# Patient Record
Sex: Female | Born: 1980 | Race: White | Hispanic: No | Marital: Married | Smoking: Never smoker
Health system: Southern US, Community
[De-identification: ages and names within clinical notes are randomized; demographics above are authoritative.]

## PROBLEM LIST (undated history)

## (undated) DIAGNOSIS — E669 Obesity, unspecified: Secondary | ICD-10-CM

## (undated) DIAGNOSIS — F329 Major depressive disorder, single episode, unspecified: Secondary | ICD-10-CM

## (undated) DIAGNOSIS — F32A Depression, unspecified: Secondary | ICD-10-CM

## (undated) DIAGNOSIS — F419 Anxiety disorder, unspecified: Secondary | ICD-10-CM

## (undated) DIAGNOSIS — K219 Gastro-esophageal reflux disease without esophagitis: Secondary | ICD-10-CM

## (undated) HISTORY — DX: Obesity, unspecified: E66.9

## (undated) HISTORY — DX: Depression, unspecified: F32.A

## (undated) HISTORY — DX: Anxiety disorder, unspecified: F41.9

## (undated) HISTORY — DX: Major depressive disorder, single episode, unspecified: F32.9

## (undated) HISTORY — DX: Gastro-esophageal reflux disease without esophagitis: K21.9

## (undated) HISTORY — PX: OTHER SURGICAL HISTORY: SHX169

---
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
## 2001-07-15 ENCOUNTER — Other Ambulatory Visit: Admission: RE | Admit: 2001-07-15 | Discharge: 2001-07-15 | Payer: Self-pay | Admitting: Obstetrics and Gynecology

## 2002-07-21 ENCOUNTER — Other Ambulatory Visit: Admission: RE | Admit: 2002-07-21 | Discharge: 2002-07-21 | Payer: Self-pay | Admitting: Obstetrics and Gynecology

## 2003-09-01 ENCOUNTER — Other Ambulatory Visit: Admission: RE | Admit: 2003-09-01 | Discharge: 2003-09-01 | Payer: Self-pay | Admitting: Family Medicine

## 2003-09-03 ENCOUNTER — Other Ambulatory Visit: Admission: RE | Admit: 2003-09-03 | Discharge: 2003-09-03 | Payer: Self-pay | Admitting: Family Medicine

## 2004-03-08 ENCOUNTER — Ambulatory Visit: Payer: Self-pay | Admitting: Internal Medicine

## 2004-03-10 ENCOUNTER — Ambulatory Visit: Payer: Self-pay | Admitting: Family Medicine

## 2004-04-25 ENCOUNTER — Ambulatory Visit: Payer: Self-pay | Admitting: Family Medicine

## 2004-07-20 ENCOUNTER — Other Ambulatory Visit: Admission: RE | Admit: 2004-07-20 | Discharge: 2004-07-20 | Payer: Self-pay | Admitting: Obstetrics and Gynecology

## 2004-09-21 HISTORY — PX: TONSILLECTOMY AND ADENOIDECTOMY: SHX28

## 2005-04-19 ENCOUNTER — Other Ambulatory Visit: Admission: RE | Admit: 2005-04-19 | Discharge: 2005-04-19 | Payer: Self-pay | Admitting: Obstetrics and Gynecology

## 2005-05-15 ENCOUNTER — Ambulatory Visit: Payer: Self-pay | Admitting: Internal Medicine

## 2008-02-21 ENCOUNTER — Emergency Department (HOSPITAL_BASED_OUTPATIENT_CLINIC_OR_DEPARTMENT_OTHER): Admission: EM | Admit: 2008-02-21 | Discharge: 2008-02-21 | Payer: Self-pay | Admitting: Emergency Medicine

## 2008-02-21 IMAGING — CT CT MAXILLOFACIAL W/O CM
3 series · 16 of 47 positions shown, 19 images · non-contrast
Comparison: None

CLINICAL DATA: Injury to nose.

CT MAXILLOFACIAL WITHOUT CONTRAST
TECHNIQUE: Multidetector CT imaging of the maxillofacial
structures was performed. Multiplanar CT image reconstructions were
also generated.

[Series 3: face/orbit 2.0 h30s st · axial · 0.35mm/px · z∈[-168,-24]mm · 10 of 84 slices shown, 13 images]
[im 6/84  brain]
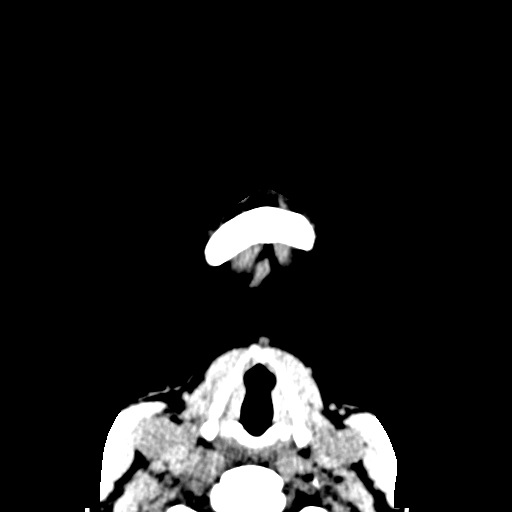
[im 6/84  bone]
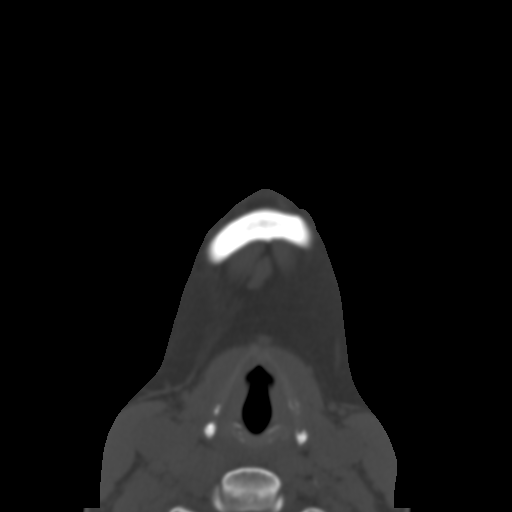
[im 15/84  bone]
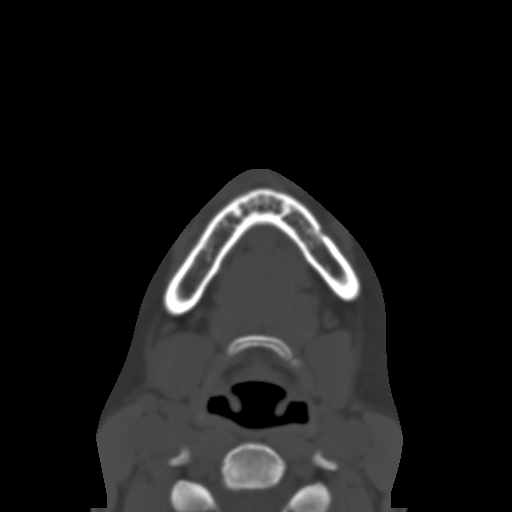
[im 23/84  bone]
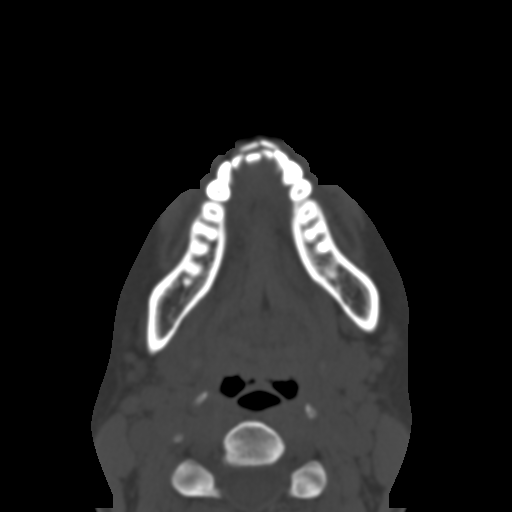
[im 29/84  bone]
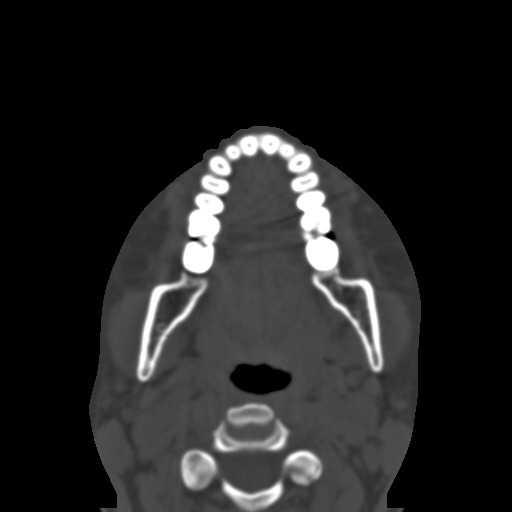
[im 38/84  brain]
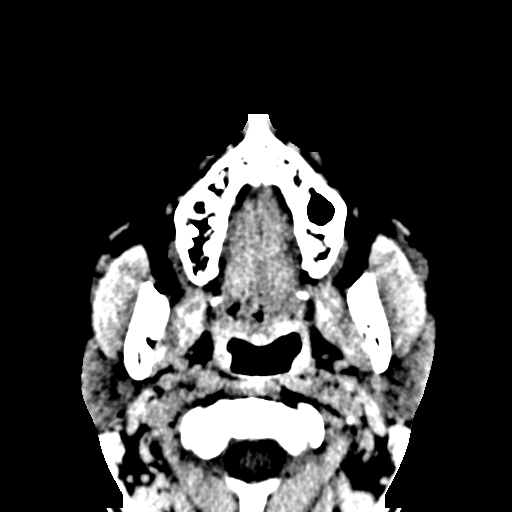
[im 38/84  bone]
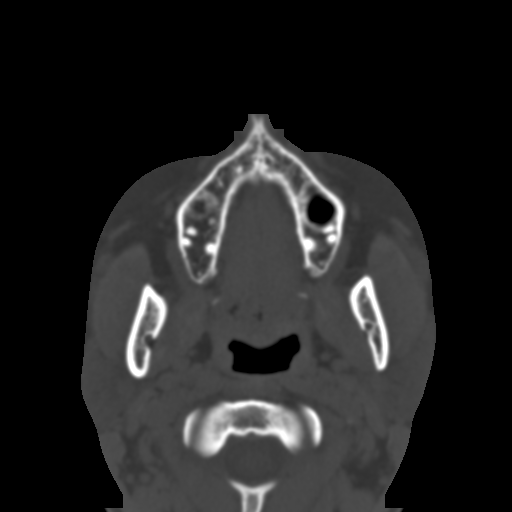
[im 46/84  bone]
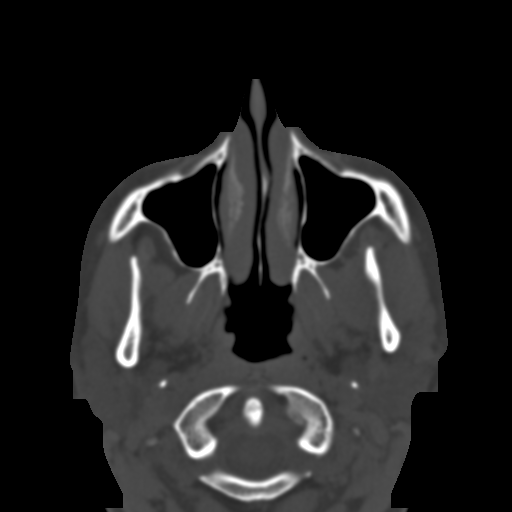
[im 55/84  bone]
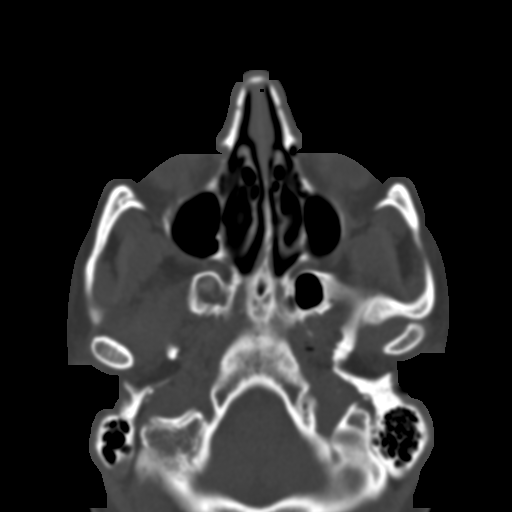
[im 63/84  bone]
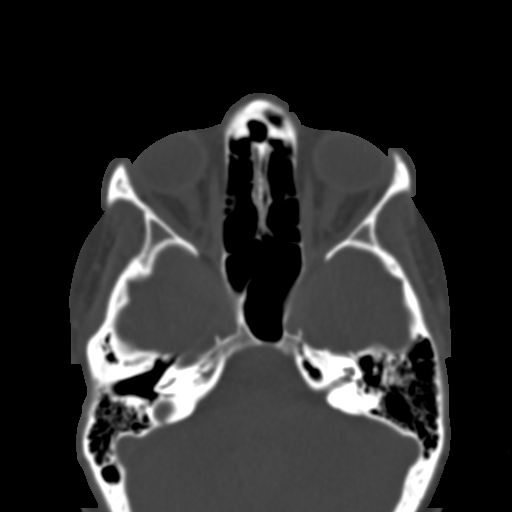
[im 69/84  brain]
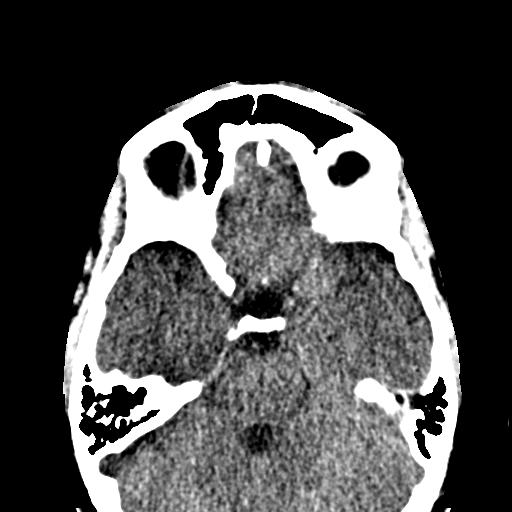
[im 69/84  bone]
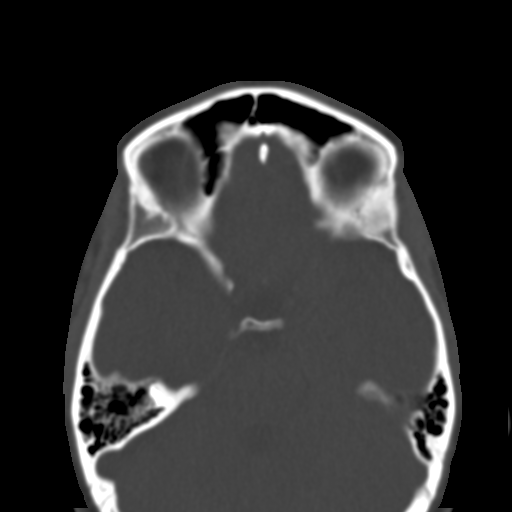
[im 78/84  bone]
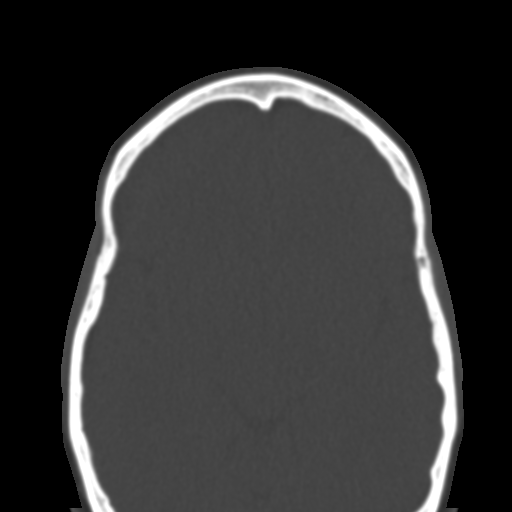

[Series 4: face/orbit 2.0 coronal · coronal · 0.36mm/px · 3 of 74 slices shown]
[im 25/74  bone]
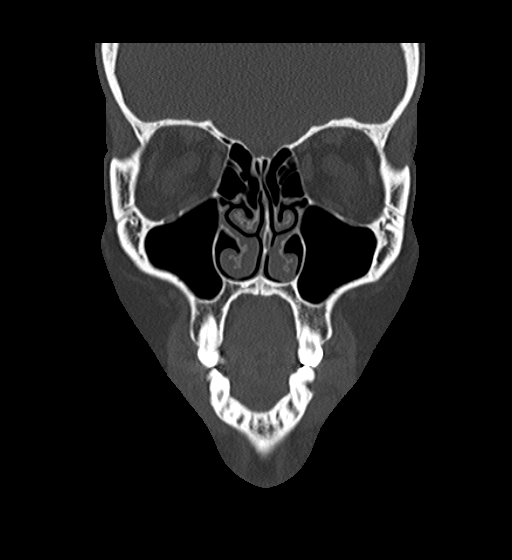
[im 33/74  bone]
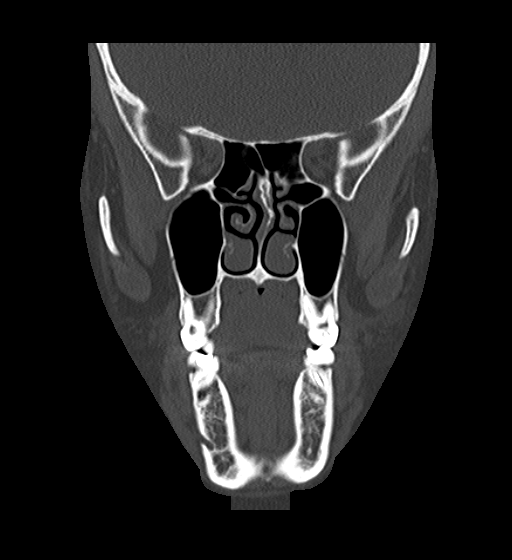
[im 41/74  bone]
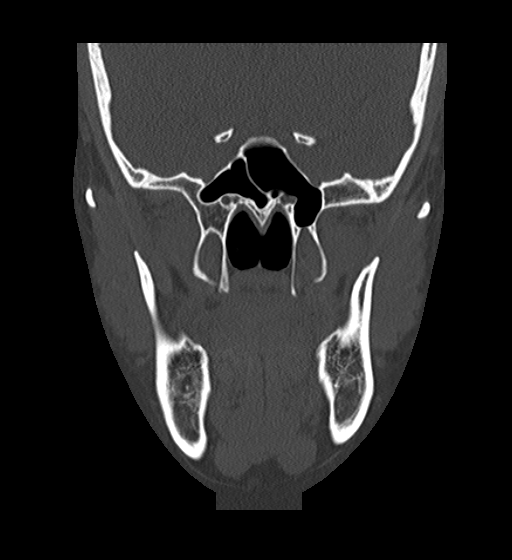

[Series 5: face/orbit 2.0 sagittal · sagittal · 0.42mm/px · 3 of 70 slices shown]
[im 24/70  bone]
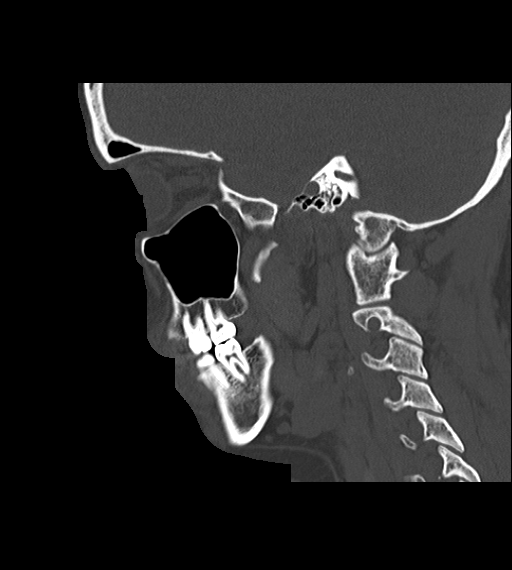
[im 35/70  bone]
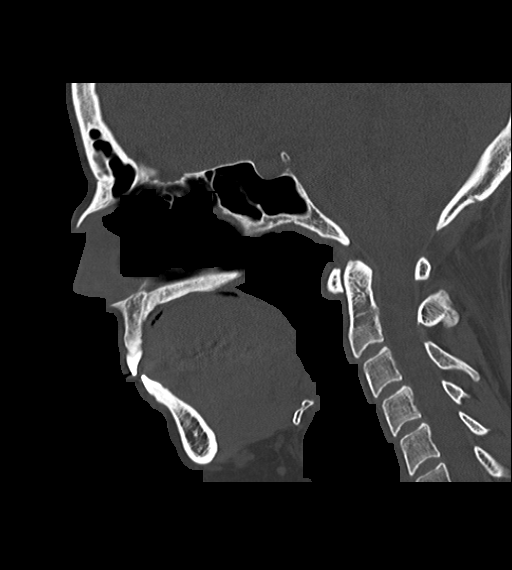
[im 47/70  bone]
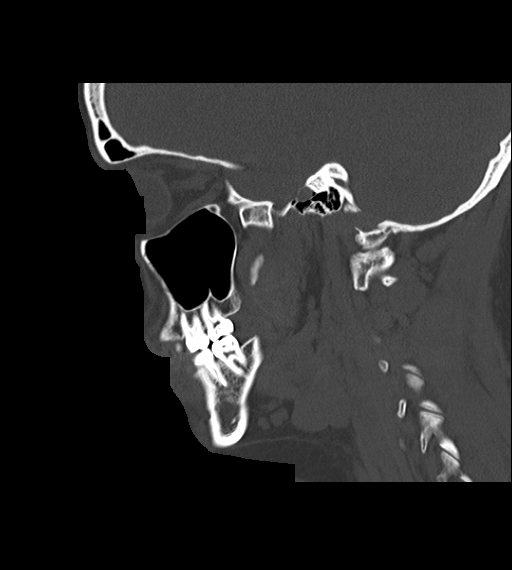

[16 of 47 positions shown; findings below may reference images not displayed]

FINDINGS: Negative for facial fracture.  There is no nasal
fracture.  The sinuses are clear.  The orbit appears normal
bilaterally.
IMPRESSION: Negative

## 2009-02-06 ENCOUNTER — Emergency Department (HOSPITAL_BASED_OUTPATIENT_CLINIC_OR_DEPARTMENT_OTHER): Admission: EM | Admit: 2009-02-06 | Discharge: 2009-02-06 | Payer: Self-pay | Admitting: Emergency Medicine

## 2009-04-26 ENCOUNTER — Emergency Department (HOSPITAL_BASED_OUTPATIENT_CLINIC_OR_DEPARTMENT_OTHER): Admission: EM | Admit: 2009-04-26 | Discharge: 2009-04-26 | Payer: Self-pay | Admitting: Emergency Medicine

## 2010-07-09 LAB — BASIC METABOLIC PANEL
BUN: 11 mg/dL (ref 6–23)
CO2: 20 mEq/L (ref 19–32)
Calcium: 10.1 mg/dL (ref 8.4–10.5)
Chloride: 104 mEq/L (ref 96–112)
Creatinine, Ser: 0.8 mg/dL (ref 0.4–1.2)
GFR calc Af Amer: >60 mL/min (ref >60)
GFR calc non Af Amer: >60 mL/min (ref >60)
Glucose, Bld: 97 mg/dL (ref 70–99)
Potassium: 3.9 mEq/L (ref 3.5–5.1)
Sodium: 142 mEq/L (ref 135–145)

## 2010-07-09 LAB — URINALYSIS, ROUTINE W REFLEX MICROSCOPIC
Bilirubin Urine: NEGATIVE
Glucose, UA: NEGATIVE mg/dL
Hgb urine dipstick: NEGATIVE
Ketones, ur: 15 mg/dL — AB
Nitrite: NEGATIVE
Protein, ur: NEGATIVE mg/dL
Specific Gravity, Urine: 1.025 (ref 1.005–1.030)
Urobilinogen, UA: 0.2 mg/dL (ref 0.0–1.0)
pH: 5.5 (ref 5.0–8.0)

## 2010-07-09 LAB — PREGNANCY, URINE: Preg Test, Ur: NEGATIVE

## 2010-07-27 LAB — URINALYSIS, ROUTINE W REFLEX MICROSCOPIC
Hgb urine dipstick: NEGATIVE
Ketones, ur: >80 mg/dL — AB
Specific Gravity, Urine: 1.034 — ABNORMAL HIGH (ref 1.005–1.030)
Urobilinogen, UA: 0.2 mg/dL (ref 0.0–1.0)
pH: 5.5 (ref 5.0–8.0)

## 2010-07-27 LAB — COMPREHENSIVE METABOLIC PANEL
ALT: 13 U/L (ref 0–35)
AST: 25 U/L (ref 0–37)
Alkaline Phosphatase: 81 U/L (ref 39–117)
CO2: 20 mEq/L (ref 19–32)
Calcium: 10.7 mg/dL — ABNORMAL HIGH (ref 8.4–10.5)
Chloride: 103 mEq/L (ref 96–112)
GFR calc Af Amer: >60 mL/min (ref >60)
GFR calc non Af Amer: >60 mL/min (ref >60)
Glucose, Bld: 174 mg/dL — ABNORMAL HIGH (ref 70–99)
Potassium: 3.8 mEq/L (ref 3.5–5.1)
Sodium: 141 mEq/L (ref 135–145)

## 2010-07-27 LAB — URINE MICROSCOPIC-ADD ON

## 2010-07-27 LAB — DIFFERENTIAL
Basophils Relative: 1 % (ref 0–1)
Eosinophils Absolute: 0.1 10*3/uL (ref 0.0–0.7)
Eosinophils Relative: 1 % (ref 0–5)
Lymphs Abs: 1.5 10*3/uL (ref 0.7–4.0)
Neutrophils Relative %: 82 % — ABNORMAL HIGH (ref 43–77)

## 2010-07-27 LAB — LIPASE, BLOOD: Lipase: 121 U/L (ref 23–300)

## 2010-07-27 LAB — CBC
Hemoglobin: 16 g/dL — ABNORMAL HIGH (ref 12.0–15.0)
MCHC: 33.6 g/dL (ref 30.0–36.0)
RBC: 5.67 MIL/uL — ABNORMAL HIGH (ref 3.87–5.11)
WBC: 13.1 10*3/uL — ABNORMAL HIGH (ref 4.0–10.5)

## 2011-04-27 ENCOUNTER — Encounter: Payer: Self-pay | Admitting: Internal Medicine

## 2011-05-09 ENCOUNTER — Encounter: Payer: Self-pay | Admitting: Internal Medicine

## 2011-05-10 ENCOUNTER — Telehealth: Payer: Self-pay | Admitting: Internal Medicine

## 2011-05-10 NOTE — Telephone Encounter (Signed)
Received copies from [facility],on 05/10/11. Forwarded  21pages to Dr. Rochel Brome review.

## 2011-05-14 ENCOUNTER — Ambulatory Visit: Payer: BC Managed Care – PPO

## 2011-05-14 ENCOUNTER — Ambulatory Visit (INDEPENDENT_AMBULATORY_CARE_PROVIDER_SITE_OTHER): Payer: BC Managed Care – PPO | Admitting: Internal Medicine

## 2011-05-14 ENCOUNTER — Encounter: Payer: Self-pay | Admitting: Internal Medicine

## 2011-05-14 DIAGNOSIS — R109 Unspecified abdominal pain: Secondary | ICD-10-CM

## 2011-05-14 DIAGNOSIS — R110 Nausea: Secondary | ICD-10-CM

## 2011-05-14 DIAGNOSIS — F419 Anxiety disorder, unspecified: Secondary | ICD-10-CM | POA: Insufficient documentation

## 2011-05-14 DIAGNOSIS — K219 Gastro-esophageal reflux disease without esophagitis: Secondary | ICD-10-CM

## 2011-05-14 LAB — COMPREHENSIVE METABOLIC PANEL
ALT: 20 U/L (ref 0–35)
Alkaline Phosphatase: 62 U/L (ref 39–117)
Potassium: 4.4 mEq/L (ref 3.5–5.1)
Sodium: 139 mEq/L (ref 135–145)
Total Bilirubin: 0.5 mg/dL (ref 0.3–1.2)
Total Protein: 7.3 g/dL (ref 6.0–8.3)

## 2011-05-14 LAB — CBC WITH DIFFERENTIAL/PLATELET
Basophils Absolute: 0 10*3/uL (ref 0.0–0.1)
Eosinophils Absolute: 0.2 10*3/uL (ref 0.0–0.7)
Eosinophils Relative: 2.7 % (ref 0.0–5.0)
HCT: 40.1 % (ref 36.0–46.0)
Lymphs Abs: 2.5 10*3/uL (ref 0.7–4.0)
MCHC: 34 g/dL (ref 30.0–36.0)
MCV: 83.5 fl (ref 78.0–100.0)
Monocytes Absolute: 0.5 10*3/uL (ref 0.1–1.0)
Platelets: 264 10*3/uL (ref 150.0–400.0)
RDW: 13.2 % (ref 11.5–14.6)

## 2011-05-14 LAB — HCG, QUANTITATIVE, PREGNANCY: hCG, Beta Chain, Quant, S: <2 m[IU]/mL

## 2011-05-14 LAB — LIPASE: Lipase: 24 U/L (ref 11.0–59.0)

## 2011-05-14 MED ORDER — HYOSCYAMINE SULFATE 0.125 MG SL SUBL: 0.1250 mg | SUBLINGUAL_TABLET | SUBLINGUAL | Status: AC | PRN

## 2011-05-14 MED ORDER — DEXLANSOPRAZOLE 60 MG PO CPDR: 60.0000 mg | DELAYED_RELEASE_CAPSULE | Freq: Every day | ORAL | Status: AC

## 2011-05-14 NOTE — Patient Instructions (Signed)
You have been scheduled for an abdominal ultrasound at [facility] [address] on 05/16/2011 at 8:00am. Please arrive 15 minutes prior to your appointment for registration. Make certain not to have anything to eat or drink 6 hours prior to your appointment. Should you need to reschedule your appointment, please contact radiology at [phone]  We have sent the following medications to your pharmacy for you to pick up at your convenience:Dexilant, Levsin  Your physician has requested that you go to the basement for the following lab work before leaving today: CBC, CMP, Lipase, Celiac, H.Pylori, HCG  Dr. Rhea Belton would like you to return in 1 month for a follow up visit

## 2011-05-14 NOTE — Progress Notes (Signed)
Subjective:    Patient ID: Jordan Harris, female    DOB: Sep 05, 1980, 31 y.o.   MRN: [id number]  Gastrophageal Reflux   Jordan Harris is a 31 year old female with a past medical history of GERD, controlled anxiety and depression who is seen today for the first time for evaluation of nausea and ongoing GERD. She is alone today. She reports a somewhat long history of heartburn and reflux symptoms. This was initially worked up in 2010 by Dr. Rhetta Mura and [facility]. At that time she was on Nexium twice daily, but did not find significant improvement in her reflux. She underwent an upper endoscopy in 2010 which was normal. She also recalls a solid phase gastric emptying study at that time, also reportedly normal. She was told that she was diagnosed possibly with IBS, and given a prescription for doxepin, but she never took this medication.  Today she reports ongoing issues with heartburn, despite twice daily ranitidine. She is currently having heartburn about twice per week, but over the last 2-3 weeks almost daily. She is avoiding eating late at night and sleeping with her head propped up. She does not drink alcohol. She reports ongoing globus sensation. No true dysphagia or odynophagia..  she is having nausea without vomiting. She also reports some cramping epigastric and left upper quadrant abdominal pain. This pain is worse with eating, but also can occur at night. She's tried to modify her diet by eating only bland foods such as saltine crackers and ginger ale, and this is helped only modestly. She's also had decreased appetite. She reports having tested herself for pregnancy at home 2 weeks ago and this was reportedly negative. She no longer has periods as she has an IUD in place.  She reports her bowel movements have been loose of late, though still only approximately once per day. She denies melena or bright red blood per rectum. She also reports occasionally feeling left lower corner spasm  after bowel movement. This a long-standing symptom for her dating back over 3 or 4 years. She also reports occasional hiccups or a "weird squeak", which happens at unexpected times. This is also long-standing.   Review of Systems ROS Constitutional: Negative for fever, chills, night sweats, see history of present illness HEENT: Negative for sore throat, mouth sores and trouble swallowing. Eyes: Negative for visual disturbance Respiratory: Negative for cough, chest tightness and shortness of breath Cardiovascular: Negative for chest pain, palpitations and lower extremity swelling Gastrointestinal: See history of present illness Genitourinary: Negative for dysuria and hematuria. Musculoskeletal: Negative for back pain, arthralgias and myalgias Skin: Negative for rash or color change Neurological: Negative for headaches, weakness, numbness Hematological: Negative for adenopathy, negative for easy bruising/bleeding Psychiatric/behavioral: Positive for depressed mood, positive for anxiety  Past Medical History  Diagnosis Date  . Anxiety   . Depression   GERD  Past Surgical History  Procedure Date  . Tonsillectomy and adenoidectomy 2006   Meds: Ranitidine Celexa  No Known Allergies  Family History  Problem Relation Age of Onset  . Heart disease Father   . Prostate cancer Father   . Pancreatic cancer Paternal Grandmother   . Lung cancer Mother   . Kidney cancer Mother    Social History  . Marital Status: Married    Number of Children: 0   Occupational History  . Hair Stylist    Social History Main Topics  . Smoking status: Never Smoker   . Smokeless tobacco: Never Used  . Alcohol Use: No  .  Drug Use: No      Objective:   Physical Exam BP 120/70  Pulse 79 Constitutional: Well-developed and well-nourished. No distress. HEENT: Normocephalic and atraumatic. Oropharynx is clear and moist. No oropharyngeal exudate. Conjunctivae are normal. Pupils are equal round and  reactive to light. No scleral icterus. Neck: Neck supple. Trachea midline. Cardiovascular: Normal rate, regular rhythm and intact distal pulses. No M/R/G Pulmonary/chest: Effort normal and breath sounds normal. No wheezing, rales or rhonchi. Abdominal: Soft, mild epigastric and midabdominal tenderness with deep palpation without rebound or guarding, nondistended. Bowel sounds active throughout. There are no masses palpable. No hepatosplenomegaly. Extremities: no clubbing, cyanosis, or edema Lymphadenopathy: No cervical adenopathy noted. Neurological: Alert and oriented to person place and time. Skin: Skin is warm and dry. No rashes noted. Psychiatric: Normal mood and affect. Behavior is normal.  Prior procedures EGD 12/20/2008--normal esophagus, normal stomach, normal duodenum to the third portion    Assessment & Plan:   31 year old female with a past medical history of GERD, controlled anxiety and depression who is seen today for the first time for evaluation of nausea and ongoing GERD  1. 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 She has had upper endoscopy which was normal, and this is reassuring. I cannot exclude a biliary cause of her pain, and I like to rule her out for celiac disease. I will perform labs today to include CBC, CMP, celiac panel, quantitative beta hCG, and an H. pylori serum antibody. Given that she did not respond to Nexium in the past, I will switch her from an H2 blocker to PPI, though this time I will use Dexilant.  We discussed how best to use this, specifically 30 minutes to one hour before breakfast. Also order a complete abdominal ultrasound, and this will also evaluate her gallbladder. Finally I will give her prescription for Levsin which she can use as needed for abdominal pain/spasm. I will see her back in one month's time to assess her response to new PPI therapy. At this point which also have labs and imaging  results back which may help with further treatment decisions. We have discussed GERD hygiene and I  encouraged her to continue with lifestyle modifications regarding her GERD.

## 2011-05-16 ENCOUNTER — Ambulatory Visit
Admission: RE | Admit: 2011-05-16 | Discharge: 2011-05-16 | Disposition: A | Discharge status: Home / Self Care | Payer: BC Managed Care – PPO | Source: Ambulatory Visit | Attending: Internal Medicine | Admitting: Internal Medicine

## 2011-05-16 ENCOUNTER — Telehealth: Payer: Self-pay | Admitting: *Deleted

## 2011-05-16 DIAGNOSIS — R110 Nausea: Secondary | ICD-10-CM

## 2011-05-16 IMAGING — US US ABDOMEN COMPLETE
1 series · 14 of 25 positions shown · non-contrast
Comparison: None.

CLINICAL DATA: Abdominal pain for 3 weeks.  Nausea.

COMPLETE ABDOMINAL ULTRASOUND

[Series 1: us abdomen complete · 0.26mm/px · 14 of 58 slices shown]
[im 1/58]
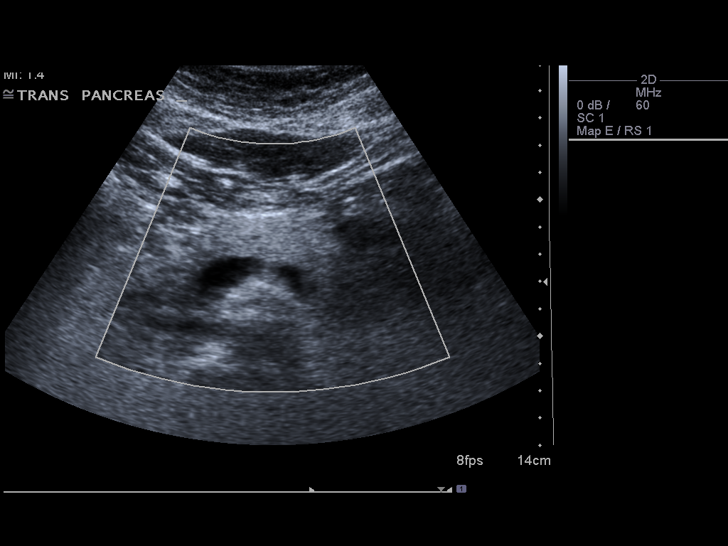
[im 5/58]
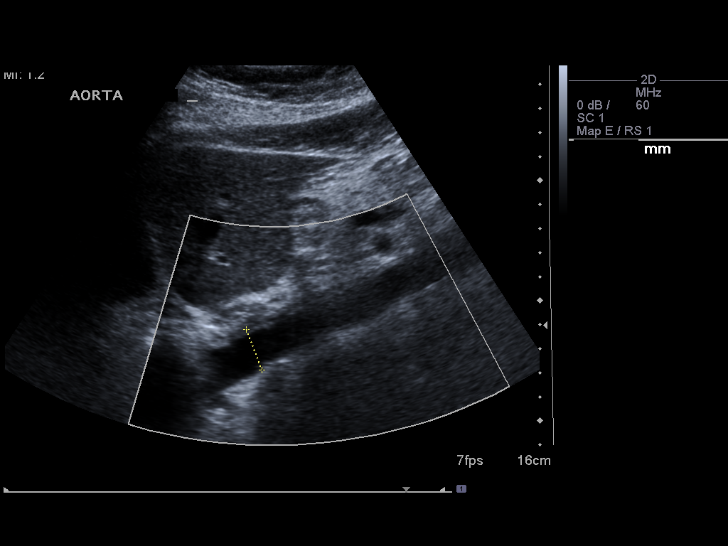
[im 10/58]
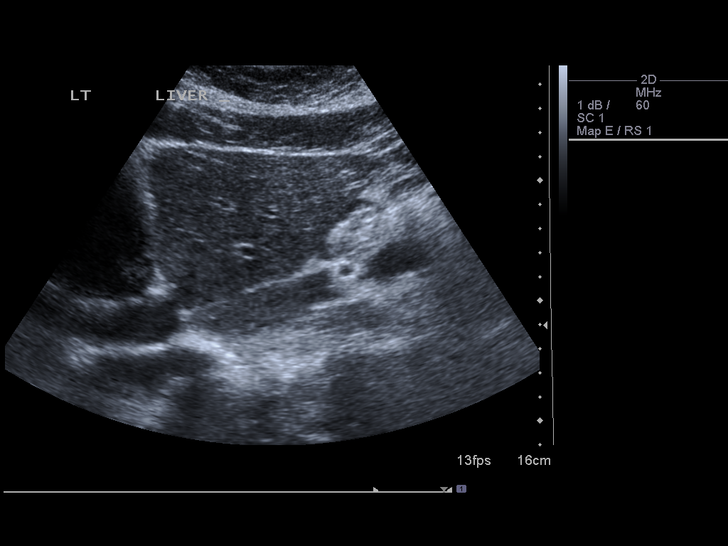
[im 15/58]
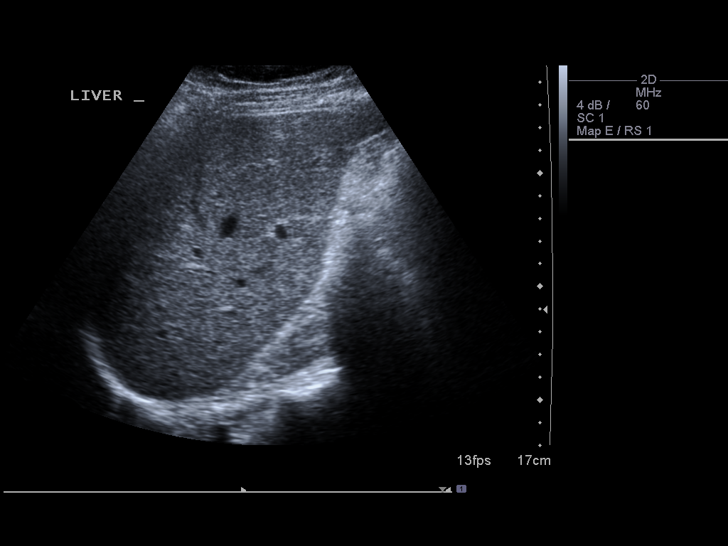
[im 20/58]
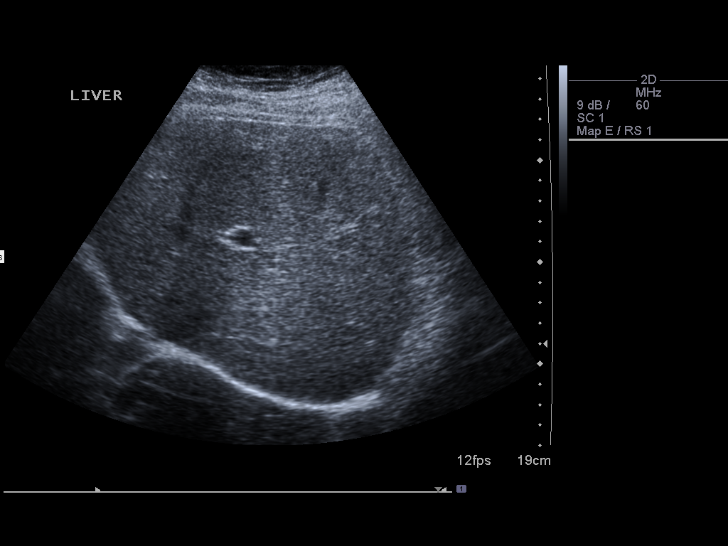
[im 22/58]
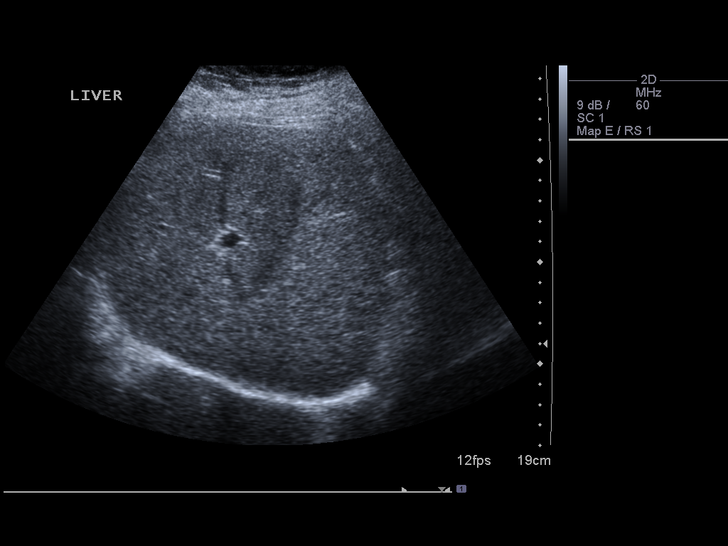
[im 27/58]
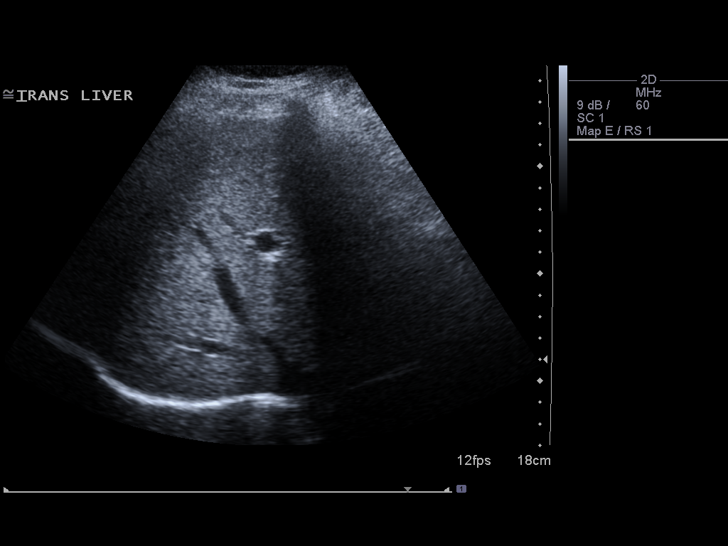
[im 31/58]
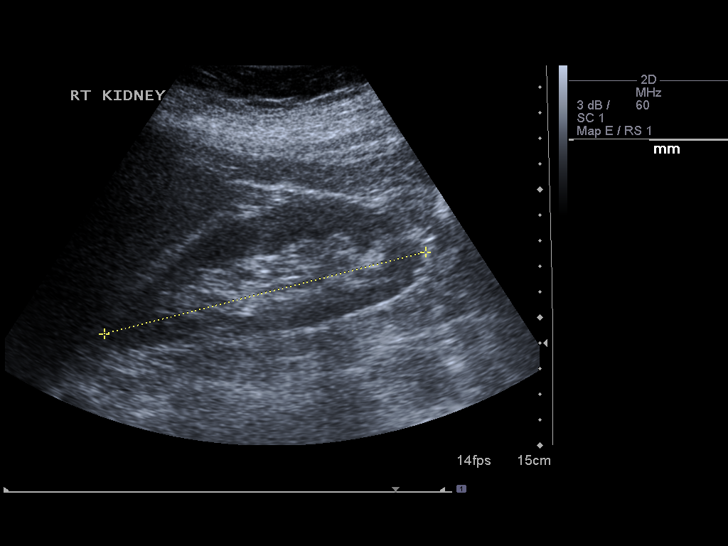
[im 36/58]
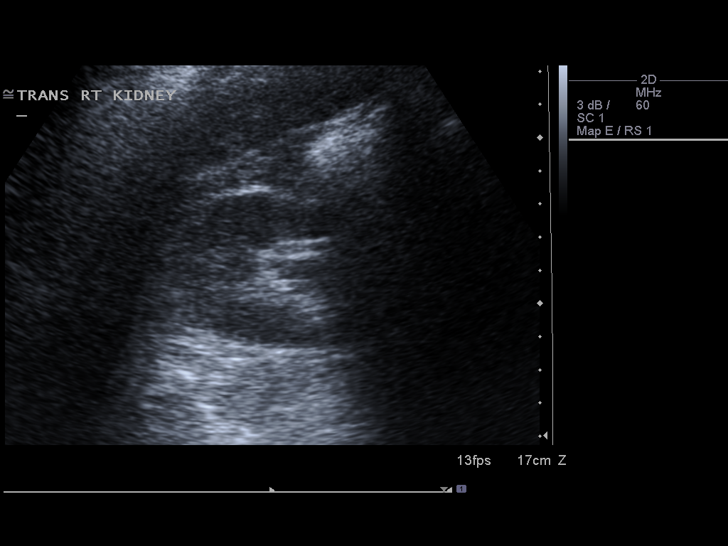
[im 39/58]
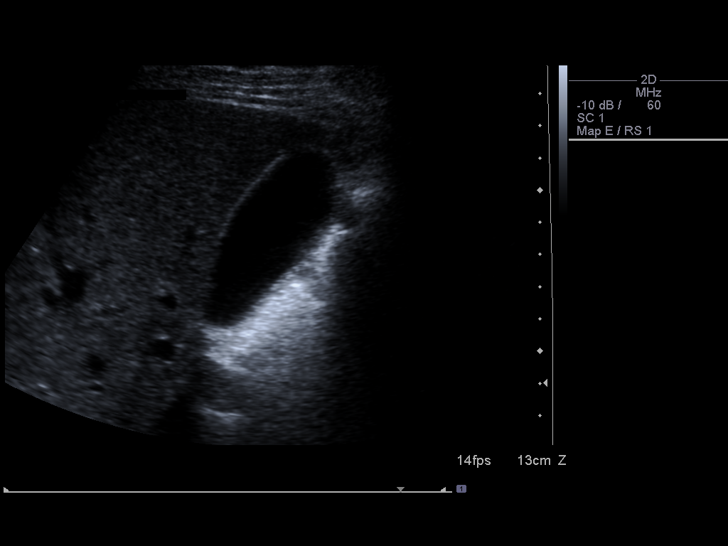
[im 43/58]
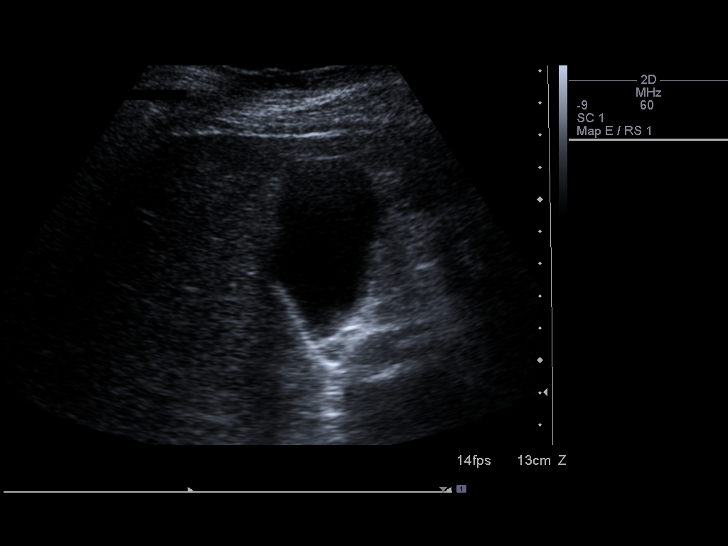
[im 48/58]
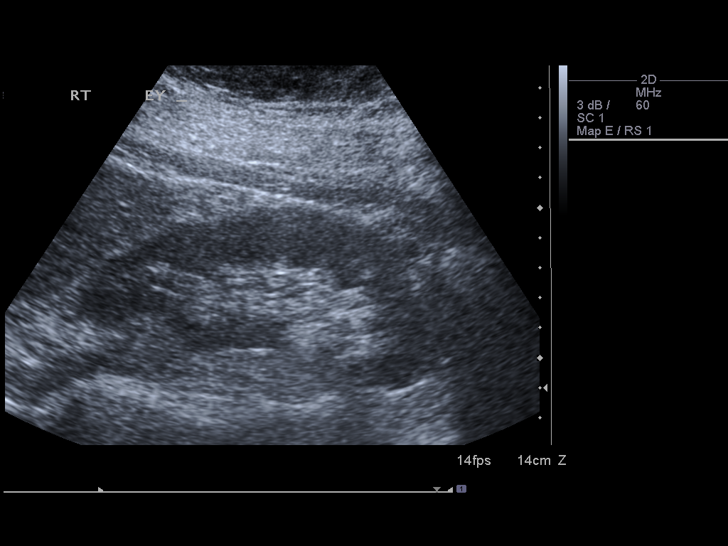
[im 53/58]
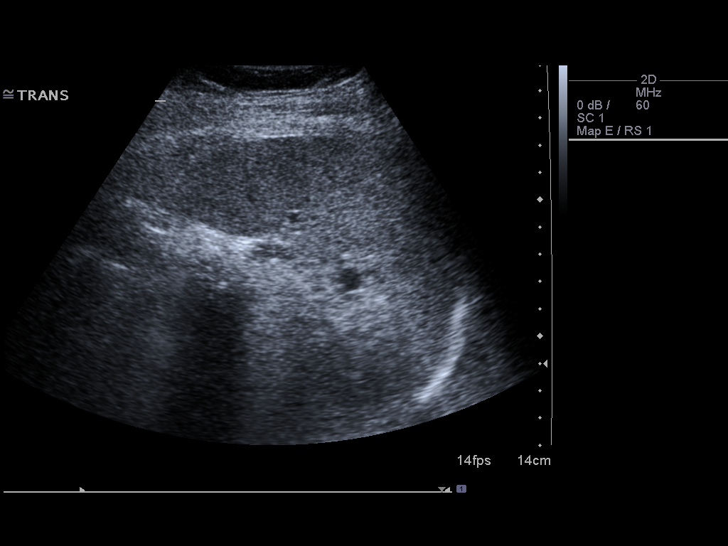
[im 58/58]
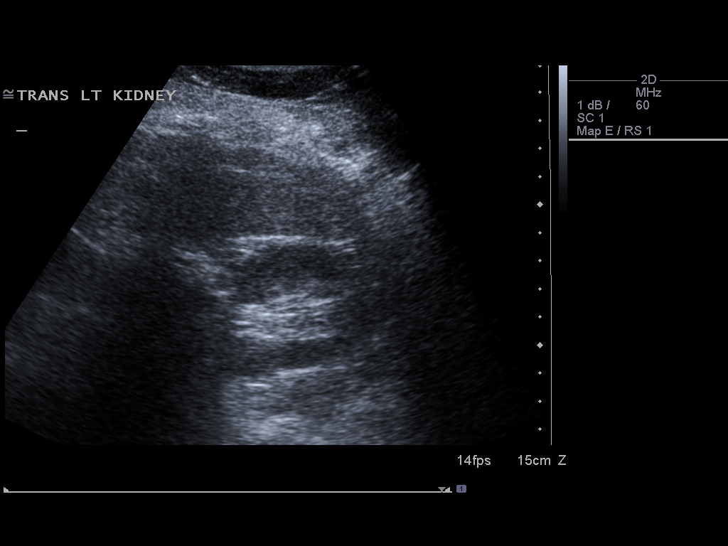

[14 of 25 positions shown; findings below may reference images not displayed]

FINDINGS: Gallbladder:  Normal, without wall thickening, stone, or
pericholecystic fluid.  Sonographic Murphy's sign was not elicited.

Common bile duct: Normal, 3 mm.

Liver: Normal in echogenicity, without focal lesion.

IVC: Negative

Pancreas:  Negative

Spleen:  Normal in size and echogenicity.

Right Kidney:  13.0 cm. No hydronephrosis.

Left Kidney:  13.4 cm. No hydronephrosis.

Abdominal aorta:  Nonaneurysmal without ascites.
IMPRESSION: Normal abdominal ultrasound.  No explanation for pain and nausea.

## 2011-05-16 NOTE — Telephone Encounter (Signed)
Notes Recorded by Erick Blinks, MD on 05/16/2011 at 9:17 AM Labs all normal and this is reassuring. H P ab neg, thyroid normal, neg celiac, normal blood counts, not pregnant   Informed pt of lab and U/S results. Pt stated understanding. Pt will f/u on 06/29/11 at 0900am. She reports improvement in her symptoms with Dexilant.

## 2011-05-16 NOTE — Telephone Encounter (Signed)
Message copied by Florene Glen on Wed May 16, 2011  2:09 PM ------      Message from: Beverley Fiedler      Created: Wed May 16, 2011 11:27 AM       Abdominal ultrasound was totally normal.      I messaged to you about her labs earlier, they were also unremarkable.      Hopefully the new PPI therapy will improve some of her symptoms. I will see her back in 4-6 weeks to assess her response to the medication (can you please be sure that the followup appointment is made). Thank you

## 2011-05-25 ENCOUNTER — Telehealth: Payer: Self-pay | Admitting: Internal Medicine

## 2011-05-25 NOTE — Telephone Encounter (Signed)
Instructed pt to stop the Dexilant and begin OTC pantoprazole. Reinforced the fact she should thake the med 30 min to 1 hour prior to her 1st meal. Call if no better; pt stated understanding.

## 2011-05-25 NOTE — Telephone Encounter (Signed)
Pt with hx of GERD dx in 2010 with normal EGD and GES; controlled Anxiety and Depression. OV 05/14/11 for N/V dx with Acid Peptic Disease; normal Abd. U/S and normal labs negative for Celiac. Pt was given Dexilant samples and a script for Levsin. Pt reports she started the Dexilant on 05/15/11 and her reflux was improving. By 05/19/11 she started stabbing pain and cramping like she was going to have diarrhea. She held the Dexilant 05/21/11 and 05/22/11 and saw Mike Gip, PA as a client on 05/23/11 who stated she hadn't heard of that type of reaction. Amy asked her to restart the med on 05/24/11 and the pain returned today. Pt failed Nexium and Zantac therapy. Please advise. Thanks.

## 2011-05-25 NOTE — Telephone Encounter (Signed)
D/c dexilant, trial of pantoprazole 40 mg daily. Still 30 min to 1 hour before 1st meal. Should work well for GERD. She should update [place] if not better

## 2011-05-28 ENCOUNTER — Other Ambulatory Visit: Payer: Self-pay | Admitting: Gastroenterology

## 2011-05-28 ENCOUNTER — Other Ambulatory Visit: Payer: Self-pay | Admitting: Internal Medicine

## 2011-05-28 DIAGNOSIS — R110 Nausea: Secondary | ICD-10-CM

## 2011-06-27 ENCOUNTER — Encounter: Payer: Self-pay | Admitting: Internal Medicine

## 2011-06-29 ENCOUNTER — Ambulatory Visit: Payer: BC Managed Care – PPO | Admitting: Internal Medicine

## 2011-07-25 ENCOUNTER — Other Ambulatory Visit: Payer: Self-pay | Admitting: Gastroenterology

## 2011-07-25 MED ORDER — PANTOPRAZOLE SODIUM 40 MG PO TBEC: 40.0000 mg | DELAYED_RELEASE_TABLET | Freq: Every day | ORAL | Status: DC

## 2011-07-25 NOTE — Telephone Encounter (Signed)
Per dr. Rhea Belton send in a Rx for protonix for pt.

## 2011-09-26 ENCOUNTER — Ambulatory Visit (INDEPENDENT_AMBULATORY_CARE_PROVIDER_SITE_OTHER): Payer: BC Managed Care – PPO | Admitting: General Surgery

## 2011-10-05 ENCOUNTER — Ambulatory Visit (INDEPENDENT_AMBULATORY_CARE_PROVIDER_SITE_OTHER): Payer: BC Managed Care – PPO | Admitting: General Surgery

## 2011-10-05 ENCOUNTER — Encounter (INDEPENDENT_AMBULATORY_CARE_PROVIDER_SITE_OTHER): Payer: Self-pay | Admitting: General Surgery

## 2011-10-05 DIAGNOSIS — Z6841 Body Mass Index (BMI) 40.0 and over, adult: Secondary | ICD-10-CM

## 2011-10-05 NOTE — Progress Notes (Signed)
Patient ID: Jordan Harris, female   DOB: 01/30/1981, 31 y.o.   MRN: [id number]  Chief Complaint  Patient presents with  . Weight Loss Surgery    HPI Jordan Harris is a 31 y.o. female.  HPI  The patient presents for evaluation for possible weight loss surgery. She has attended our informational session and is most interested in a sleeve gastrectomy. She has a BMI of 42 with gastroesophageal reflux disease. She has trouble with her weight since approximately 2007 and now she feels that she has gained too much weight that she is "too big to lose the weight." She is walking and has tried several diets with the most success coming from a "biggest loser" competition. She does have any reflux and takes Zantac once daily. She says that if she does not take her Zantac as she feels some fullness in her throat she feels that her reflux is diet induced. Past Medical History  Diagnosis Date  . Anxiety   . Depression   . GERD (gastroesophageal reflux disease)     Past Surgical History  Procedure Date  . Tonsillectomy and adenoidectomy 09/2004    Family History  Problem Relation Age of Onset  . Heart disease Father   . Prostate cancer Father   . Cancer Father     prostate  . Pancreatic cancer Paternal Grandmother   . Lung cancer Mother   . Kidney cancer Mother   . Cancer Mother     lung,kidney  . Cancer Paternal Grandfather     colon    Social History History  Substance Use Topics  . Smoking status: Never Smoker   . Smokeless tobacco: Never Used  . Alcohol Use: Yes     2 glasses of wine per week    No Known Allergies  Current Outpatient Prescriptions  Medication Sig Dispense Refill  . citalopram (CELEXA) 40 MG tablet Take 40 mg by mouth daily.      . pantoprazole (PROTONIX) 40 MG tablet Take 1 tablet (40 mg total) by mouth daily.  30 tablet  1  . ranitidine (ZANTAC) 150 MG capsule Take 150 mg by mouth. 1 -2 qd        Review of Systems Review of Systems All other review of  systems negative or noncontributory except as stated in the HPI  Blood pressure 134/82, pulse 70, temperature 97.4 F (36.3 C), temperature source Temporal, resp. rate 14, height 5\' 7"  (1.702 m), weight 268 lb 2 oz (121.621 kg).  Physical Exam Physical Exam Physical Exam  Nursing note and vitals reviewed. Constitutional: She is oriented to person, place, and time. She appears well-developed and well-nourished. No distress.  HENT:  Head: Normocephalic and atraumatic.  Mouth/Throat: No oropharyngeal exudate.  Eyes: Conjunctivae and EOM are normal. Pupils are equal, round, and reactive to light. Right eye exhibits no discharge. Left eye exhibits no discharge. No scleral icterus.  Neck: Normal range of motion. Neck supple. No tracheal deviation present.  Cardiovascular: Normal rate, regular rhythm, normal heart sounds and intact distal pulses.   Pulmonary/Chest: Effort normal and breath sounds normal. No stridor. No respiratory distress. She has no wheezes.  Abdominal: Soft. Bowel sounds are normal. She exhibits no distension and no mass. There is no tenderness. There is no rebound and no guarding.  Musculoskeletal: Normal range of motion. She exhibits no edema and no tenderness.  Neurological: She is alert and oriented to person, place, and time.  Skin: Skin is warm and dry. No rash  noted. She is not diaphoretic. No erythema. No pallor.  Psychiatric: She has a normal mood and affect. Her behavior is normal. Judgment and thought content normal.    Data Reviewed   Assessment    Morbid obesity with a BMI of 42 and GERD And I think that she would qualify for any of the procedures and but she is most interested in the sleeve gastrectomy. She is not interested in a LAP-BAND and she is much less interested in the Roux-en-Y gastric bypass. I discussed with her the pros and cons and the risks and benefits of the procedure. I expressly discussed with her the possibility of worsening her reflux even  to the point of no improvement with medications and and potentially requiring conversion to Roux-en-Y gastric bypass. She expressed understanding of this and still would like to proceed with vertical sleeve gastrectomy.   I recommended that she stop her Zantac for a few weeks to see how bad her reflux can be. We will discuss this again later. If her refluxes to severe, then she may be a better candidate for Roux-en-Y gastric bypass but again, we will leave it up to her to decide. The risks of infection, bleeding, pain, scarring, weight regain, too little or too much weight loss, vitamin deficiencies and need for lifelong vitamin supplementation, hair loss, need for protein supplementation, leaks, stricture, reflux, food intolerance, need for reoperation and conversion to roux Y gastric bypass, need for open surgery, injury to spleen or surrounding structures, DVT's, PE, and death again discussed with the patient and the patient expressed understanding and desires to proceed with laparoscopic vertical sleeve gastrectomy, possible open, intraoperative endoscopy. . Was spent discussing the procedures with the patient.     Plan    We will start with the preoperative laboratory studies, upper GI, nutrition and psychology evaluations.       Lodema Pilot DAVID 10/05/2011, 2:53 PM

## 2011-10-12 ENCOUNTER — Ambulatory Visit (HOSPITAL_COMMUNITY): Payer: BC Managed Care – PPO

## 2011-10-29 ENCOUNTER — Encounter: Payer: BC Managed Care – PPO | Attending: General Surgery | Admitting: *Deleted

## 2011-10-29 ENCOUNTER — Ambulatory Visit (HOSPITAL_COMMUNITY)
Admission: RE | Admit: 2011-10-29 | Discharge: 2011-10-29 | Disposition: A | Discharge status: Home / Self Care | Payer: BC Managed Care – PPO | Source: Ambulatory Visit | Attending: General Surgery | Admitting: General Surgery

## 2011-10-29 ENCOUNTER — Encounter: Payer: Self-pay | Admitting: *Deleted

## 2011-10-29 DIAGNOSIS — K219 Gastro-esophageal reflux disease without esophagitis: Secondary | ICD-10-CM | POA: Insufficient documentation

## 2011-10-29 DIAGNOSIS — Z01818 Encounter for other preprocedural examination: Secondary | ICD-10-CM | POA: Insufficient documentation

## 2011-10-29 DIAGNOSIS — F3289 Other specified depressive episodes: Secondary | ICD-10-CM | POA: Insufficient documentation

## 2011-10-29 DIAGNOSIS — Z6841 Body Mass Index (BMI) 40.0 and over, adult: Secondary | ICD-10-CM | POA: Insufficient documentation

## 2011-10-29 DIAGNOSIS — F329 Major depressive disorder, single episode, unspecified: Secondary | ICD-10-CM | POA: Insufficient documentation

## 2011-10-29 DIAGNOSIS — Z713 Dietary counseling and surveillance: Secondary | ICD-10-CM | POA: Insufficient documentation

## 2011-10-29 LAB — CBC WITH DIFFERENTIAL/PLATELET
Basophils Relative: 0 % (ref 0–1)
HCT: 39.5 % (ref 36.0–46.0)
Hemoglobin: 13.5 g/dL (ref 12.0–15.0)
MCHC: 34.2 g/dL (ref 30.0–36.0)
Monocytes Absolute: 0.5 10*3/uL (ref 0.1–1.0)
Monocytes Relative: 7 % (ref 3–12)
Neutro Abs: 3.9 10*3/uL (ref 1.7–7.7)

## 2011-10-29 IMAGING — RF DG UGI W/ KUB
15 of 17 series · 15 of 17 positions shown · non-contrast
Comparison: none

CLINICAL DATA: Morbid obesity.  Pre-op evaluation for bariatric
surgery.

UPPER GI SERIES WITH KUB
TECHNIQUE: After obtaining a scout radiograph a single-column
upper GI series was performed using thin barium.
Fluoroscopy time: 2.2 minutes

[Series 1: run · 1 of 1 slices shown (1 of 13)]
[im 1/1]
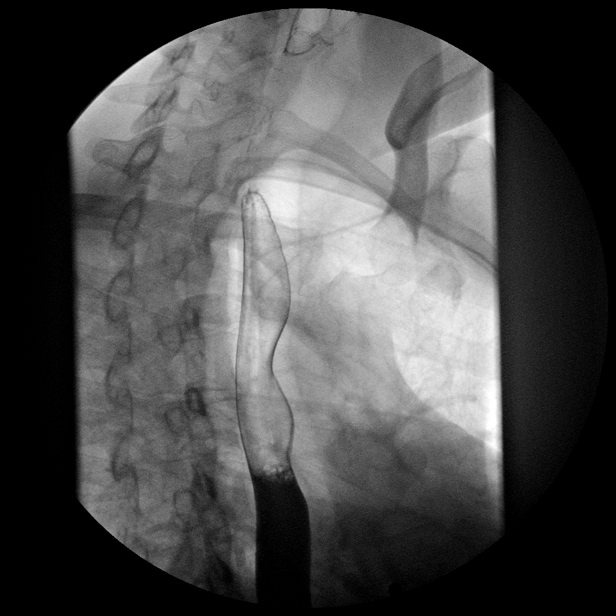

[Series 2: run · 1 of 1 slices shown (2 of 13)]
[im 1/1]
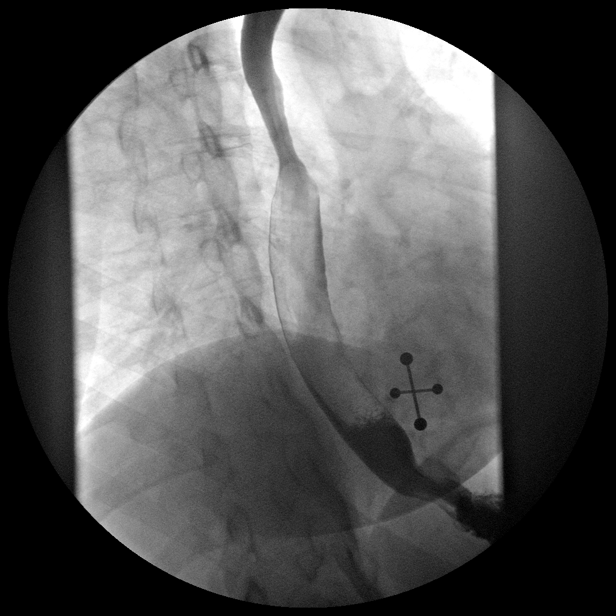

[Series 3: run · 1 of 1 slices shown (3 of 13)]
[im 1/1]
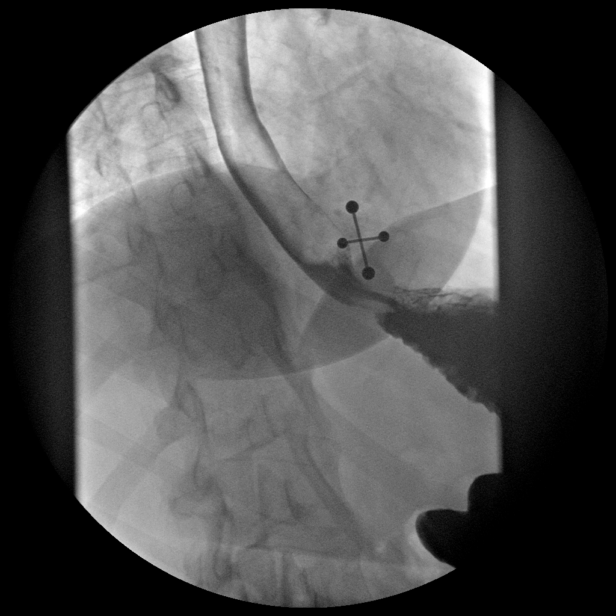

[Series 4: run · 1 of 1 slices shown (4 of 13)]
[im 1/1]
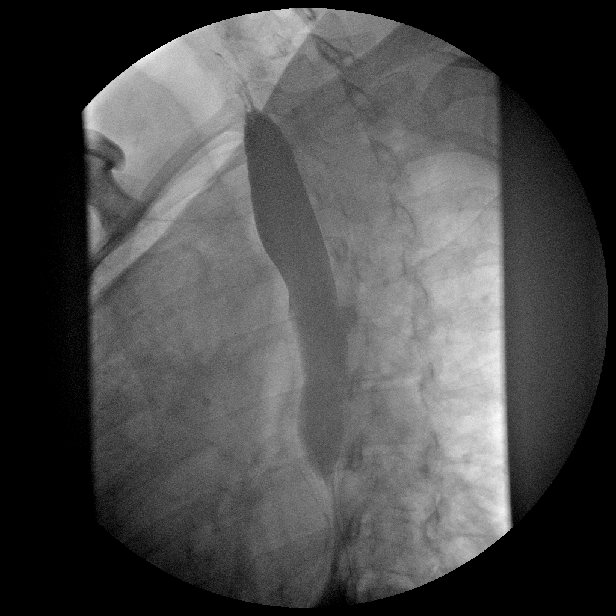

[Series 6: run · 1 of 1 slices shown (5 of 13)]
[im 1/1]
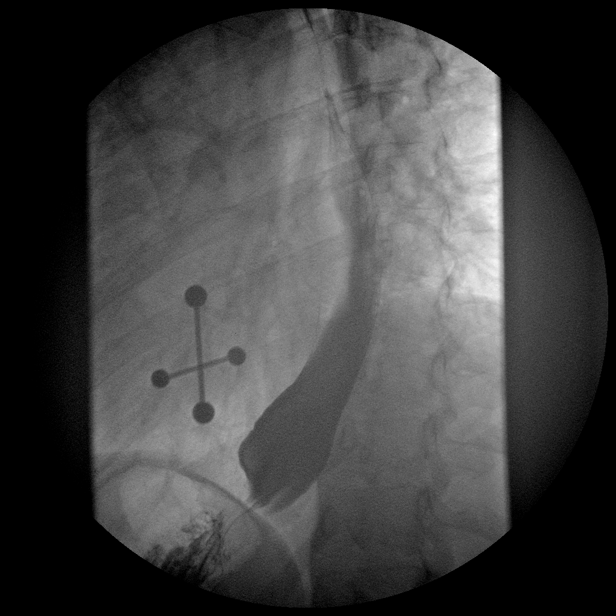

[Series 7: run · 1 of 1 slices shown (6 of 13)]
[im 1/1]
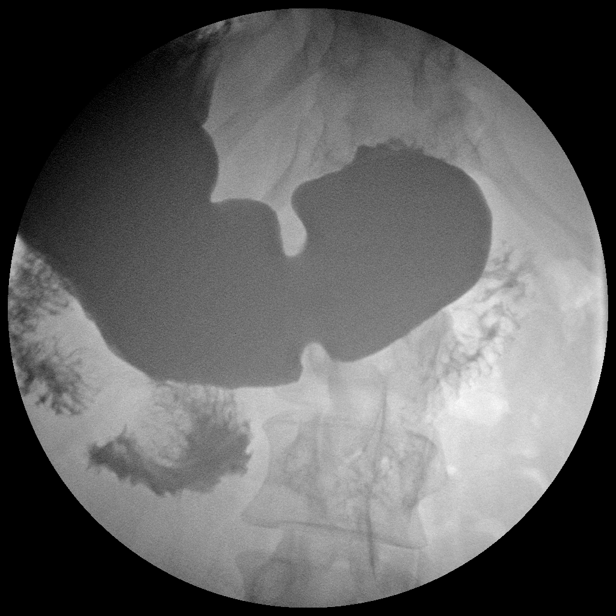

[Series 8: run · 1 of 1 slices shown (7 of 13)]
[im 1/1]
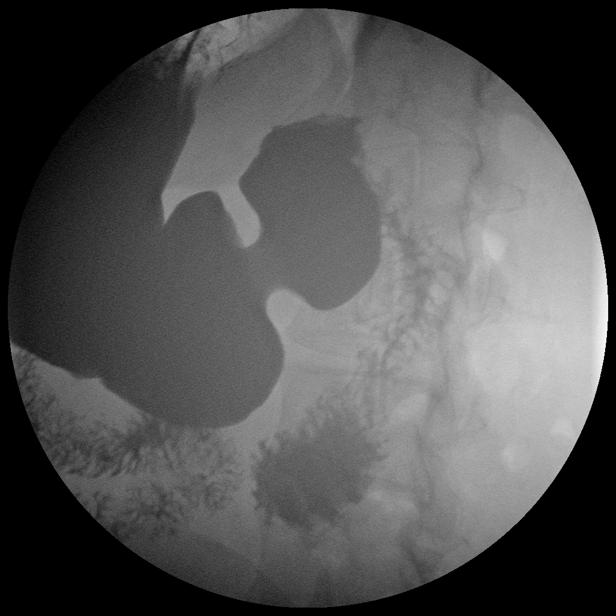

[Series 9: run · 1 of 1 slices shown (8 of 13)]
[im 1/1]
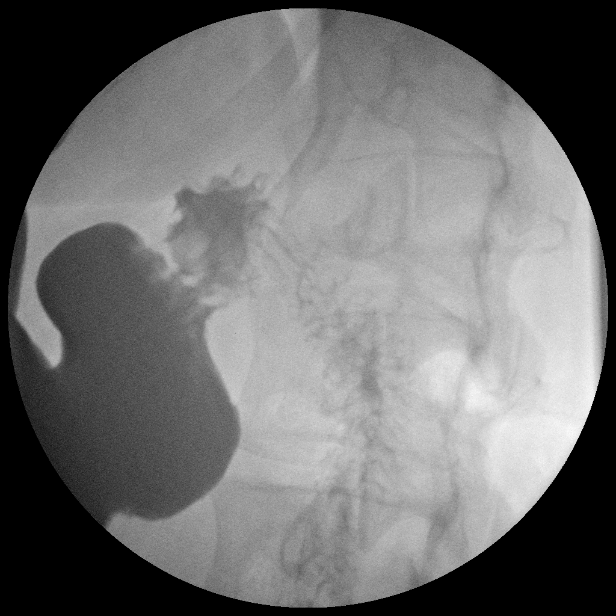

[Series 10: run · 1 of 1 slices shown (9 of 13)]
[im 1/1]
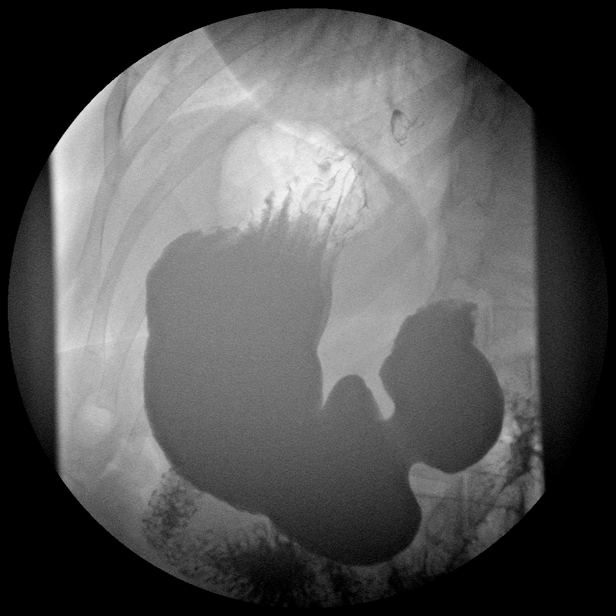

[Series 11: run · 1 of 1 slices shown (10 of 13)]
[im 1/1]
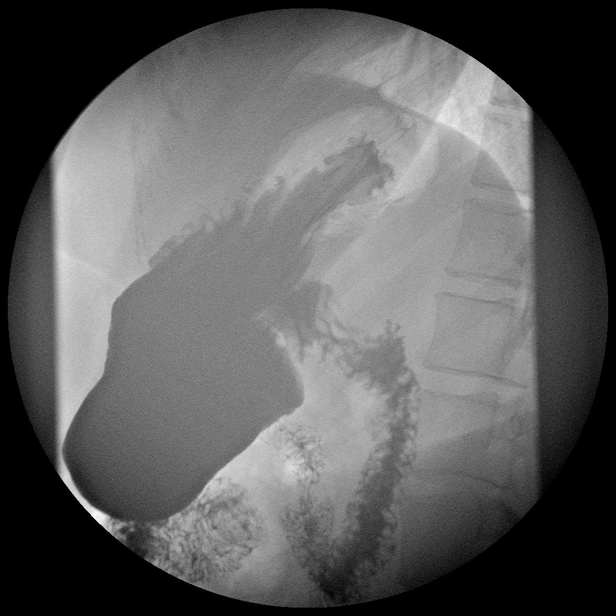

[Series 12: run · 1 of 1 slices shown (11 of 13)]
[im 1/1]
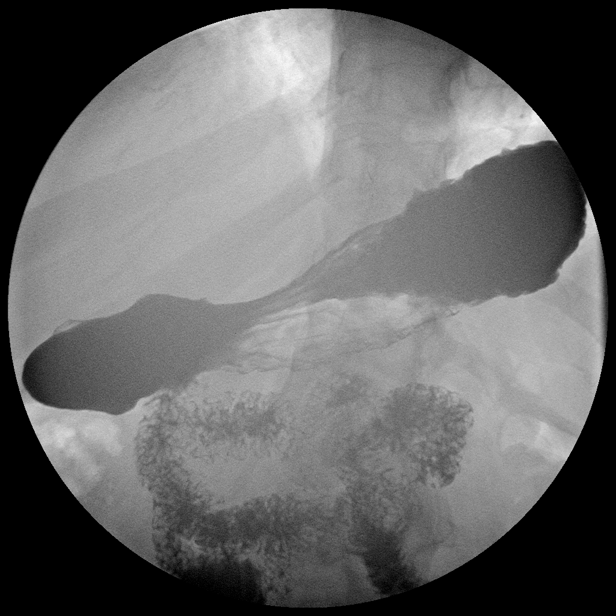

[Series 14: run · 1 of 1 slices shown (12 of 13)]
[im 1/1]
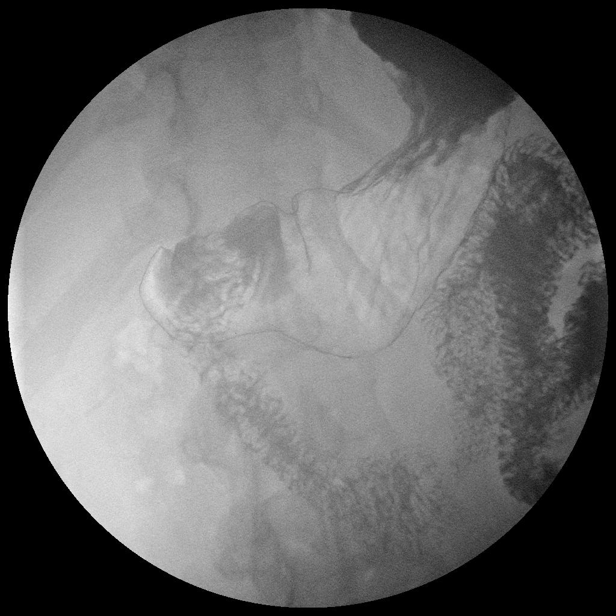

[Series 15: run · 1 of 1 slices shown (13 of 13)]
[im 1/1]
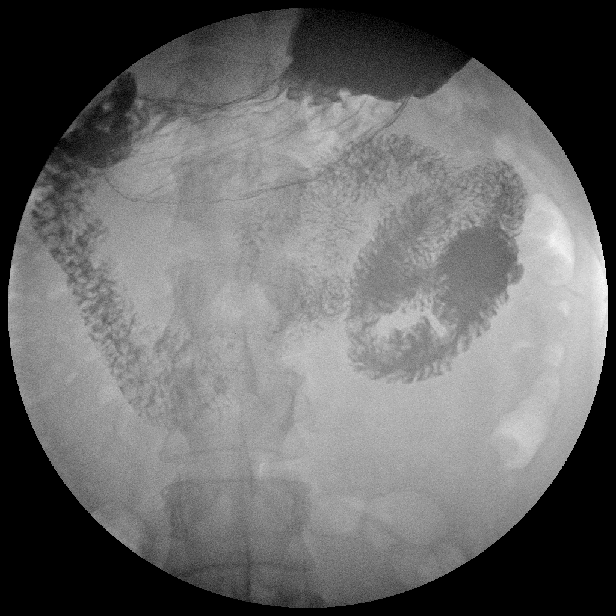

[Series 1001: view not recorded · 0.20mm/px · 1 of 1 slices shown (1 of 2)]
[im 1/1]
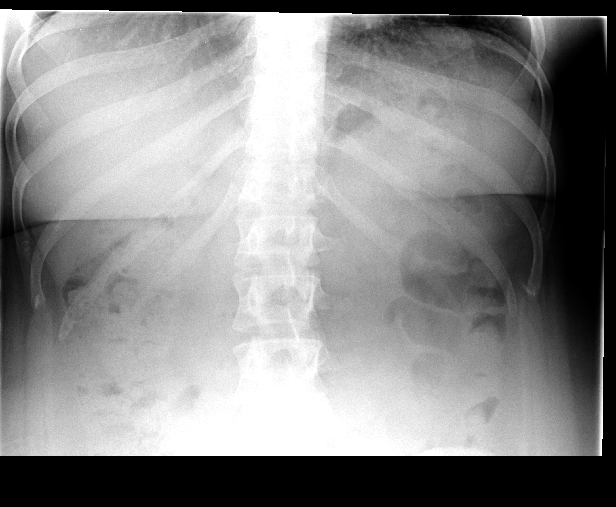

[Series 1002: view not recorded · 0.20mm/px · 1 of 1 slices shown (2 of 2)]
[im 1/1]
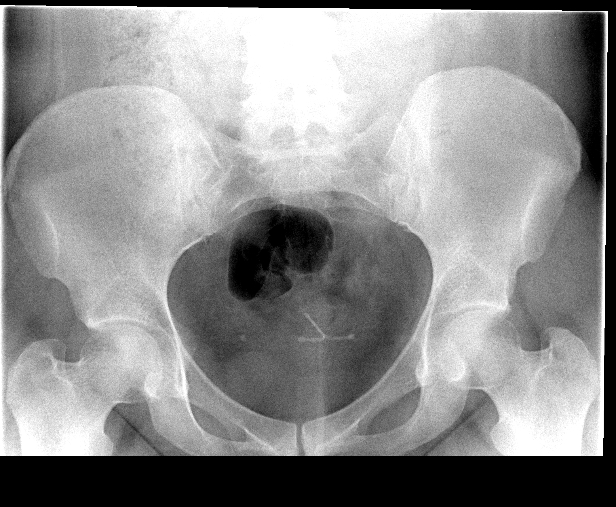

[15 of 17 positions shown; findings below may reference images not displayed]

FINDINGS: The scout radiograph shows a normal bowel gas pattern.
IUD noted in the pelvis.

There is no evidence of esophageal mass or stricture.  There is no
evidence of hiatal hernia, and no gastroesophageal reflux was seen
during the exam.  Esophageal motility is within normal limits.

The stomach is normal in appearance.  There is no evidence of
gastric masses or ulcers.  Duodenal bulb and sweep are normal in
appearance.
IMPRESSION: Negative UGI series.

## 2011-10-29 NOTE — Patient Instructions (Addendum)
   Follow Pre-Op Nutrition Goals to prepare for Gastric Sleeve Surgery.   Call the [facility] at [phone] once you have been given your surgery date to enrolled in the Pre-Op Nutrition Class. You will need to attend this nutrition class 3-4 weeks prior to your surgery.  

## 2011-10-29 NOTE — Progress Notes (Addendum)
  Pre-Op Assessment Visit:  Pre-Operative Gastric Sleeve Surgery  Medical Nutrition Therapy:  Appt start time: 1200   End time:  1300.  Patient was seen on 10/29/2011 for Pre-Operative Gastric Sleeve Nutrition Assessment. Assessment and letter of approval faxed to [facility] Bariatric Surgery Program coordinator on 10/29/2011.  Approval letter sent to [facility] and will be available in the chart under the media tab.  TANITA  BODY COMP RESULTS  10/29/11   %Fat 50.9%   Fat Mass (lbs) 139.0   Fat Free Mass (lbs) 134.5   Total Body Water (lbs) 98.5   Handouts given during visit include:  Pre-Op Goals   Bariatric Surgery Protein Shakes   Samples given during visit include:   Unjury Protein Powder Chocolate Splendor: 1 ea - Lot # [id number]; Exp: 08/14 Unflavored: 1 ea - Lot # [id number]; Exp: 08/14  Patient to call for Pre-Op and Post-Op Nutrition Education at the [facility] when surgery is scheduled.

## 2011-10-30 LAB — COMPREHENSIVE METABOLIC PANEL
Albumin: 4.2 g/dL (ref 3.5–5.2)
Alkaline Phosphatase: 58 U/L (ref 39–117)
BUN: 13 mg/dL (ref 6–23)
Calcium: 9.4 mg/dL (ref 8.4–10.5)
Chloride: 105 mEq/L (ref 96–112)
Glucose, Bld: 87 mg/dL (ref 70–99)
Potassium: 4.2 mEq/L (ref 3.5–5.3)

## 2011-10-30 LAB — T4: T4, Total: 7.4 ug/dL (ref 5.0–12.5)

## 2011-10-30 LAB — TSH: TSH: 2.149 u[IU]/mL (ref 0.350–4.500)

## 2011-10-30 LAB — H. PYLORI ANTIBODY, IGG: H Pylori IgG: <0.4 {ISR}

## 2012-10-11 ENCOUNTER — Emergency Department (HOSPITAL_COMMUNITY): Payer: Self-pay

## 2012-10-11 ENCOUNTER — Encounter (HOSPITAL_COMMUNITY): Payer: Self-pay | Admitting: *Deleted

## 2012-10-11 ENCOUNTER — Emergency Department (HOSPITAL_COMMUNITY)
Admission: EM | Admit: 2012-10-11 | Discharge: 2012-10-11 | Disposition: A | Discharge status: Home / Self Care | Payer: Self-pay | Attending: Emergency Medicine | Admitting: Emergency Medicine

## 2012-10-11 DIAGNOSIS — E669 Obesity, unspecified: Secondary | ICD-10-CM | POA: Insufficient documentation

## 2012-10-11 DIAGNOSIS — Z79899 Other long term (current) drug therapy: Secondary | ICD-10-CM | POA: Insufficient documentation

## 2012-10-11 DIAGNOSIS — F411 Generalized anxiety disorder: Secondary | ICD-10-CM | POA: Insufficient documentation

## 2012-10-11 DIAGNOSIS — Y9389 Activity, other specified: Secondary | ICD-10-CM | POA: Insufficient documentation

## 2012-10-11 DIAGNOSIS — S61509A Unspecified open wound of unspecified wrist, initial encounter: Secondary | ICD-10-CM | POA: Insufficient documentation

## 2012-10-11 DIAGNOSIS — Z8719 Personal history of other diseases of the digestive system: Secondary | ICD-10-CM | POA: Insufficient documentation

## 2012-10-11 DIAGNOSIS — F329 Major depressive disorder, single episode, unspecified: Secondary | ICD-10-CM | POA: Insufficient documentation

## 2012-10-11 DIAGNOSIS — Y9289 Other specified places as the place of occurrence of the external cause: Secondary | ICD-10-CM | POA: Insufficient documentation

## 2012-10-11 DIAGNOSIS — S61512A Laceration without foreign body of left wrist, initial encounter: Secondary | ICD-10-CM

## 2012-10-11 DIAGNOSIS — W010XXA Fall on same level from slipping, tripping and stumbling without subsequent striking against object, initial encounter: Secondary | ICD-10-CM | POA: Insufficient documentation

## 2012-10-11 DIAGNOSIS — Z23 Encounter for immunization: Secondary | ICD-10-CM | POA: Insufficient documentation

## 2012-10-11 DIAGNOSIS — F3289 Other specified depressive episodes: Secondary | ICD-10-CM | POA: Insufficient documentation

## 2012-10-11 IMAGING — CR DG WRIST COMPLETE 3+V*L*
4 series · 4 of 4 positions shown · non-contrast
Comparison: None

CLINICAL DATA: Anterior wrist pain and laceration post fall

LEFT WRIST - COMPLETE 3+ VIEW

[x wrist pa left]
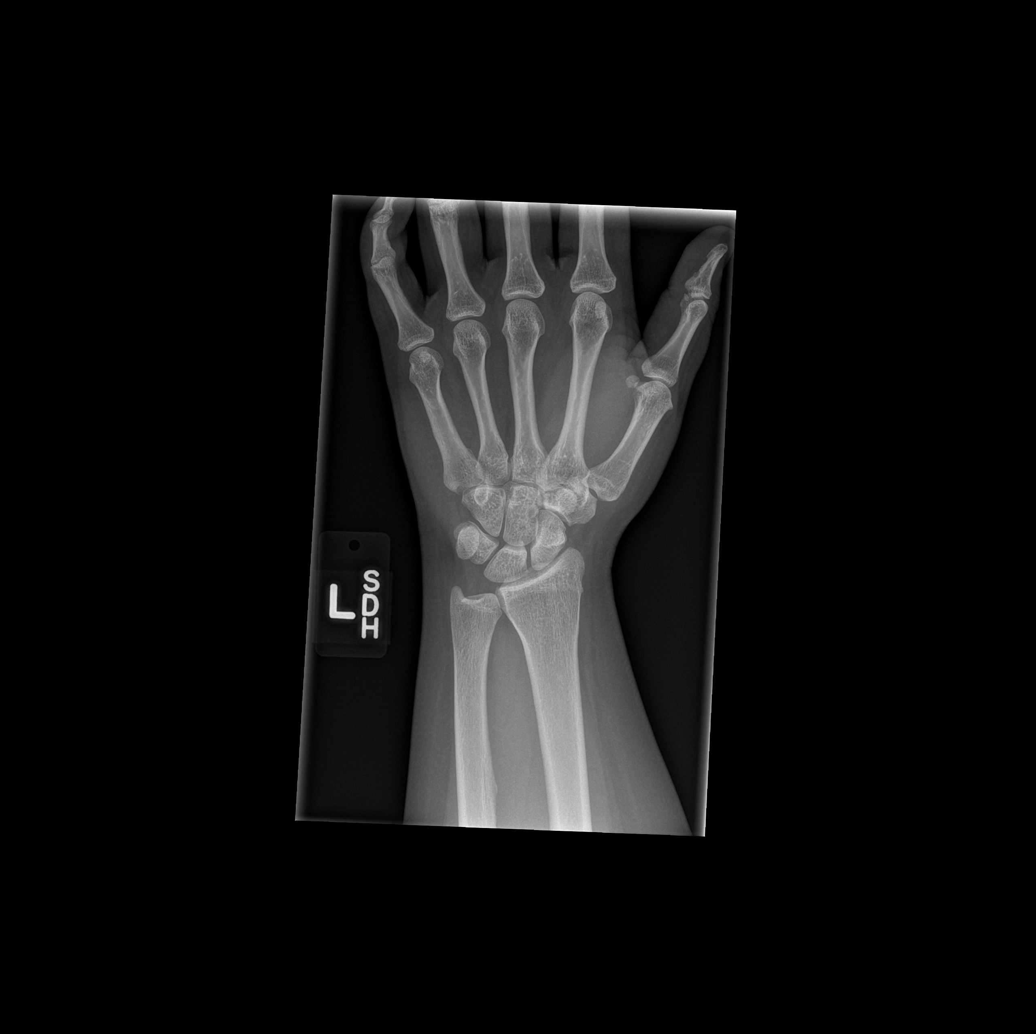

[x wrist obl left]
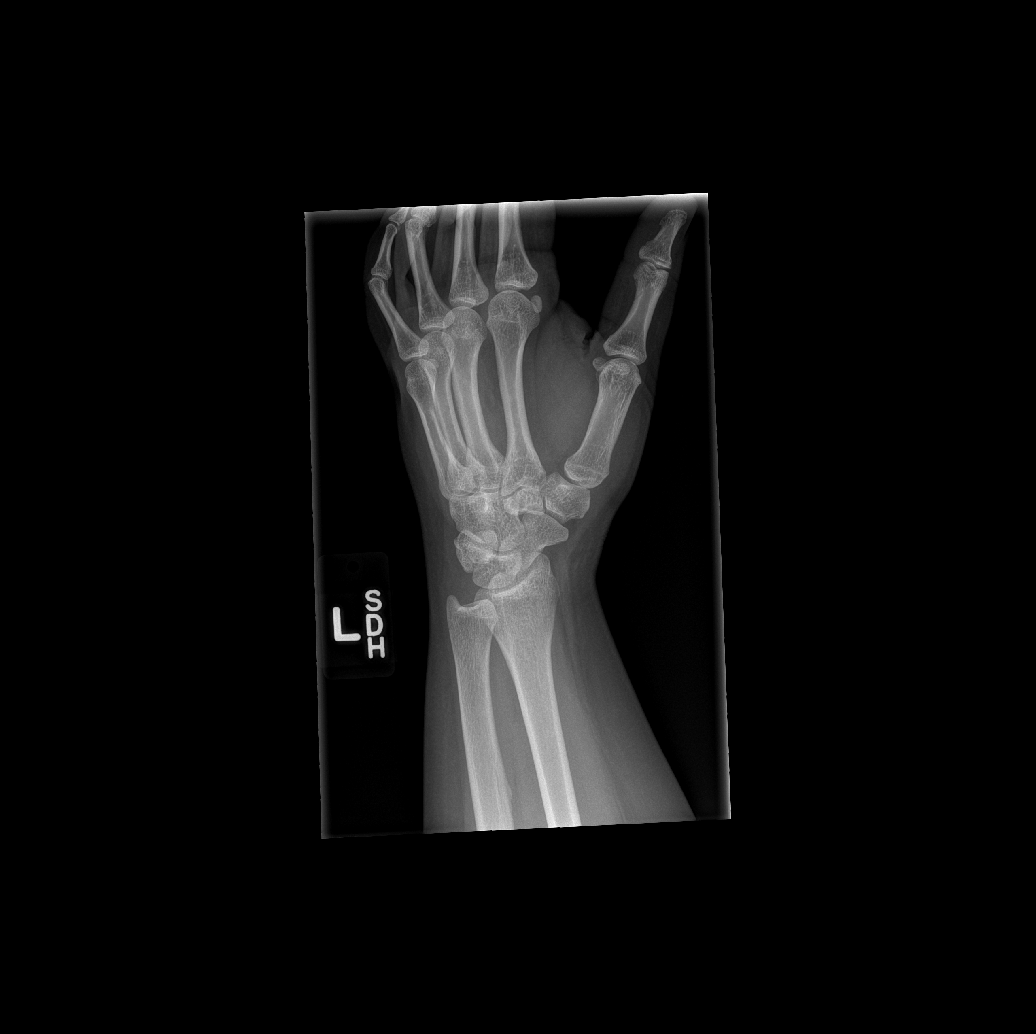

[x wrist lat left]
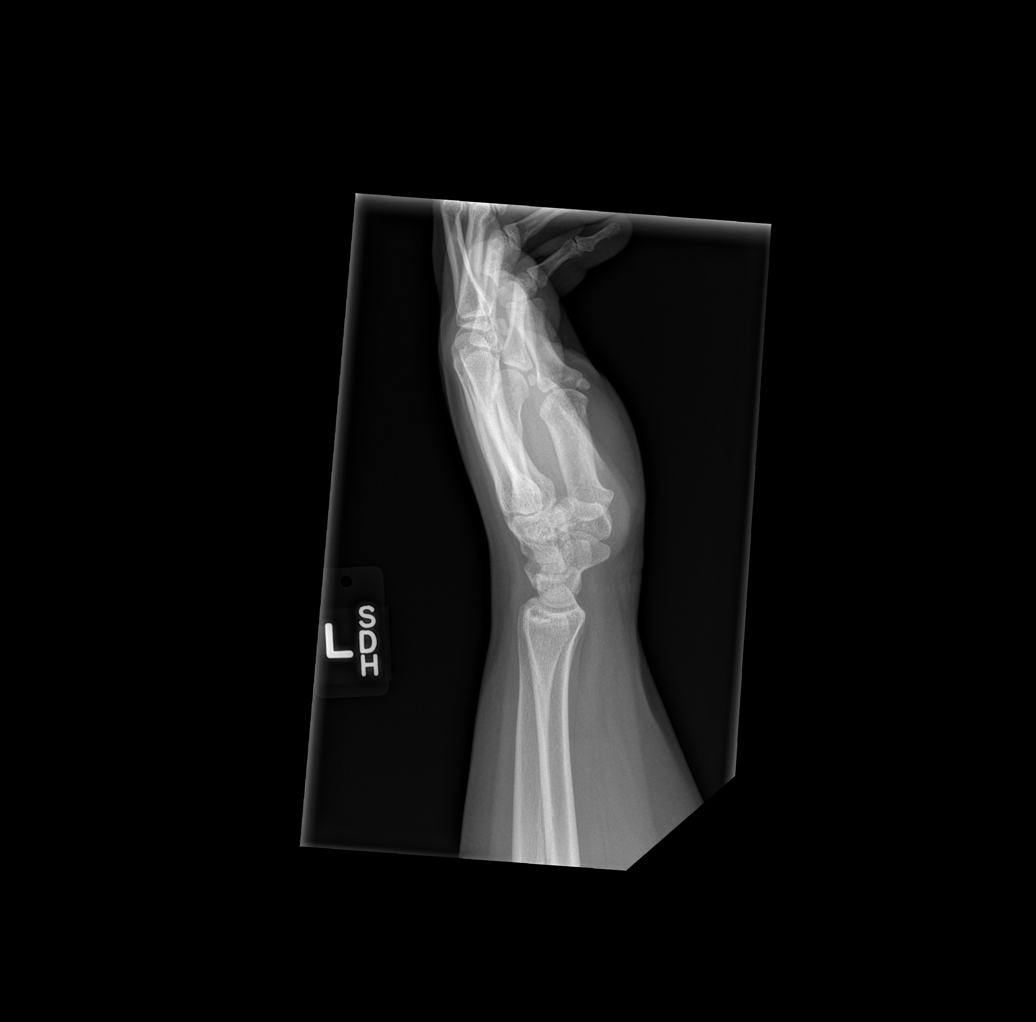

[x wrist navicular view left]
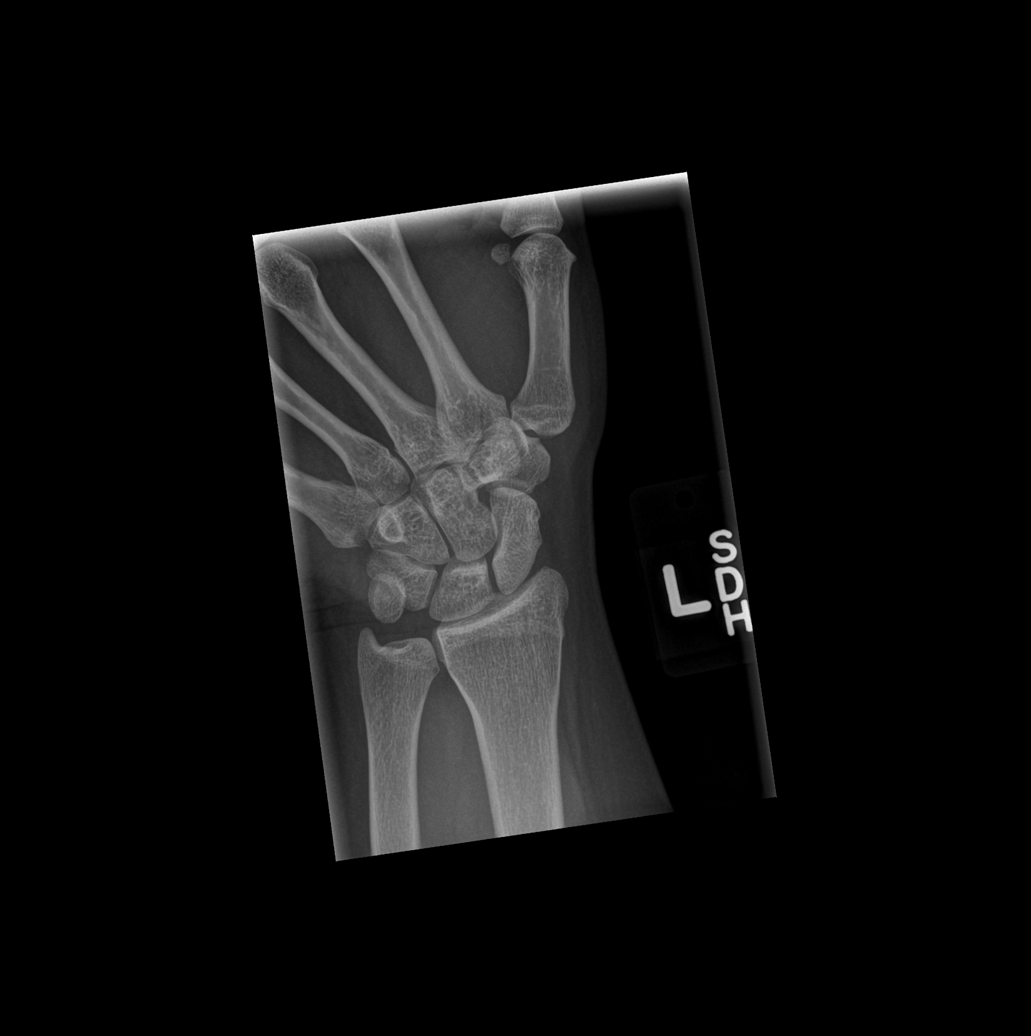

[4 of 4 positions shown; findings below may reference images not displayed]

FINDINGS: Osseous mineralization normal.
Joint spaces preserved.
No acute fracture, dislocation or bone destruction.
IMPRESSION: No acute osseous abnormalities.

## 2012-10-11 MED ORDER — TETANUS-DIPHTH-ACELL PERTUSSIS 5-2.5-18.5 LF-MCG/0.5 IM SUSP
0.5000 mL | Freq: Once | INTRAMUSCULAR | Status: AC
  Administered 2012-10-11: 0.5 mL via INTRAMUSCULAR
  Filled 2012-10-11: qty 0.5

## 2012-10-11 MED ORDER — TETANUS-DIPHTHERIA TOXOIDS TD 5-2 LFU IM INJ
0.5000 mL | INJECTION | Freq: Once | INTRAMUSCULAR | Status: DC
  Filled 2012-10-11: qty 0.5
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
## 2012-10-11 NOTE — ED Provider Notes (Signed)
History     CSN: [id number]  Arrival date & time 10/11/12  0153   First MD Initiated Contact with Patient 10/11/12 [phone]      Chief Complaint  Patient presents with  . Extremity Laceration   HPI  History provided by the patient and friends. Patient is a 32 year old female who presents with injuries and lacerations to her left hand after a fall. Patient was at a bar and tripped and fell while holding a glass beer bottle. She landed on her hand with a glass breaking causing small cuts to her left hand fingers and wrist. There was associated bleeding. She denies having any weakness or numbness in the hand or fingers. There was no head injury or LOC. She denies any other pain or injury from the fall. She is unsure of her last tetanus shot. Denies any other aggravating or alleviating rash. No other associated symptoms.     Past Medical History  Diagnosis Date  . Anxiety   . Depression   . GERD (gastroesophageal reflux disease)   . Obesity     Past Surgical History  Procedure Laterality Date  . Tonsillectomy and adenoidectomy  09/2004    Family History  Problem Relation Age of Onset  . Heart disease Father   . Prostate cancer Father   . Cancer Father     prostate  . Pancreatic cancer Paternal Grandmother   . Lung cancer Mother   . Kidney cancer Mother   . Cancer Mother     lung,kidney  . Cancer Paternal Grandfather     colon    History  Substance Use Topics  . Smoking status: Never Smoker   . Smokeless tobacco: Never Used  . Alcohol Use: Yes     Comment: 2 glasses of wine per week    OB History   Grav Para Term Preterm Abortions TAB SAB Ect Mult Living                  Review of Systems  HENT: Negative for neck pain.   Neurological: Negative for weakness, numbness and headaches.  All other systems reviewed and are negative.    Allergies  Review of patient's allergies indicates no known allergies.  Home Medications   Current Outpatient Rx  Name  Route   Sig  Dispense  Refill  . FLUoxetine (PROZAC) 40 MG capsule   Oral   Take 40 mg by mouth daily.           BP 127/80  Pulse 101  Temp(Src) 98.3 F (36.8 C) (Oral)  Resp 20  SpO2 98%  Physical Exam  Nursing note and vitals reviewed. Constitutional: She is oriented to person, place, and time. She appears well-developed and well-nourished. No distress.  HENT:  Head: Normocephalic and atraumatic.  Neck: Normal range of motion. Neck supple.  No cervical midline tenderness  Cardiovascular: Normal rate and regular rhythm.   Pulmonary/Chest: Effort normal and breath sounds normal. No respiratory distress. She has no wheezes.  Abdominal: Soft.  Musculoskeletal: She exhibits edema and tenderness.  Small superficial lacerations to the left ring finger, left thumb.  There is a small deeper laceration to the left wrist area. No deep Schuck shorthanded involvement. Full range of motion. Normal strength against resistance during our limb of wrist. Normal distal sensation to the fingers with Refill less than 2 seconds. No gross deformities in the wrist or hand. There is some associated mild swelling.  Neurological: She is alert and oriented to person,  place, and time.  Skin: Skin is warm and dry. No rash noted.  Psychiatric: She has a normal mood and affect. Her behavior is normal.    ED Course  Procedures (including critical care time)  LACERATION REPAIR Performed by: Angus Seller Authorized by: Angus Seller Consent: Verbal consent obtained. Risks and benefits: risks, benefits and alternatives were discussed Consent given by: patient Patient identity confirmed: provided demographic data Prepped and Draped in normal sterile fashion Wound explored  Laceration Location: Left wrist  Laceration Length: 0.5 cm  No Foreign Bodies seen or palpated  Anesthesia: local infiltration  Local anesthetic: lidocaine 2% without epinephrine  Anesthetic total: 3 ml  Irrigation method:  syringe Amount of cleaning: standard  Skin closure: Skin with 4-0 Prolene   Number of sutures: 1   Technique: Simple around   Patient tolerance: Patient tolerated the procedure well with no immediate complications.     Dg Wrist Complete Left  10/11/2012   *RADIOLOGY REPORT*  Clinical Data: Anterior wrist pain and laceration post fall  LEFT WRIST - COMPLETE 3+ VIEW  Comparison: None  Findings: Osseous mineralization normal. Joint spaces preserved. No acute fracture, dislocation or bone destruction.  IMPRESSION: No acute osseous abnormalities.   Original Report Authenticated By: Ulyses Southward, M.D.     1. Laceration of wrist, left, initial encounter       MDM  Patient seen and evaluated. Patient appears well in no acute distress. Small superficial lacerations to left hand with one small deeper laceration over the wrist.       Angus Seller, PA-C 10/12/12 [phone]

## 2012-10-12 NOTE — ED Provider Notes (Signed)
Medical screening examination/treatment/procedure(s) were conducted as a shared visit with non-physician practitioner(s) and myself.  I personally evaluated the patient during the encounter  And patient examined after wound repair. Sutured wound of left wrist without bleeding.  Hanley Seamen, MD 10/12/12 [phone]

## 2019-06-28 ENCOUNTER — Ambulatory Visit: Payer: Self-pay | Attending: Internal Medicine

## 2019-06-28 ENCOUNTER — Other Ambulatory Visit: Payer: Self-pay

## 2019-06-28 DIAGNOSIS — Z23 Encounter for immunization: Secondary | ICD-10-CM | POA: Insufficient documentation

## 2019-06-28 NOTE — Progress Notes (Signed)
   Covid-19 Vaccination Clinic  Name:  Sejla Marzano    MRN: [id number] DOB: August 04, 1980  06/28/2019  Ms. Wilmot was observed post Covid-19 immunization for 15 minutes without incident. She was provided with Vaccine Information Sheet and instruction to access the V-Safe system.   Ms. Merrick was instructed to call 911 with any severe reactions post vaccine: Marland Kitchen Difficulty breathing  . Swelling of face and throat  . A fast heartbeat  . A bad rash all over body  . Dizziness and weakness   Immunizations Administered    Name Date Dose VIS Date Route   Pfizer COVID-19 Vaccine 06/28/2019 11:25 AM 0.3 mL 04/03/2019 Intramuscular   Manufacturer: [employer], [employer]   Lot: [id number]   NDC: [address]-2

## 2019-07-29 ENCOUNTER — Ambulatory Visit: Payer: Self-pay | Attending: Internal Medicine

## 2019-07-29 DIAGNOSIS — Z23 Encounter for immunization: Secondary | ICD-10-CM

## 2019-07-29 NOTE — Progress Notes (Signed)
   Covid-19 Vaccination Clinic  Name:  [name]    MRN: [id number] DOB: [birth year]  07/29/2019  [name] was observed post Covid-19 immunization for 15 minutes without incident. She was provided with Vaccine Information Sheet and instruction to access the V-Safe system.   [name] was instructed to call 911 with any severe reactions post vaccine: Marland Kitchen Difficulty breathing  . Swelling of face and throat  . A fast heartbeat  . A bad rash all over body  . Dizziness and weakness   Immunizations Administered    Name Date Dose VIS Date Route   Pfizer COVID-19 Vaccine 07/29/2019 10:31 AM 0.3 mL 04/03/2019 Intramuscular   Manufacturer: [employer], [employer]   Lot: [id number]   NDC: [address]-2

## 2020-01-26 ENCOUNTER — Other Ambulatory Visit: Payer: Self-pay | Admitting: Orthopedic Surgery

## 2020-01-26 DIAGNOSIS — M25562 Pain in left knee: Secondary | ICD-10-CM

## 2020-01-26 DIAGNOSIS — R531 Weakness: Secondary | ICD-10-CM

## 2020-02-12 ENCOUNTER — Ambulatory Visit
Admission: RE | Admit: 2020-02-12 | Discharge: 2020-02-12 | Disposition: A | Discharge status: Home / Self Care | Payer: BC Managed Care – PPO | Source: Ambulatory Visit | Attending: Orthopedic Surgery | Admitting: Orthopedic Surgery

## 2020-02-12 DIAGNOSIS — M25562 Pain in left knee: Secondary | ICD-10-CM

## 2020-02-12 DIAGNOSIS — R531 Weakness: Secondary | ICD-10-CM

## 2020-02-12 IMAGING — MR MR KNEE*L* W/O CM
4 of 6 series · 21 of 40 positions shown · non-contrast
Comparison: X-ray [DATE]

CLINICAL DATA: Left knee pain for 1-2 months

EXAM:
MRI OF THE LEFT KNEE WITHOUT CONTRAST
TECHNIQUE: Multiplanar, multisequence MR imaging of the knee was performed. No
intravenous contrast was administered.

[Series 3: T2 fat-sat · axial · 4.0mm · 0.50mm/px · z∈[-49,+46]mm · 3 of 24 slices shown (1 of 2)]
[im 5/24]
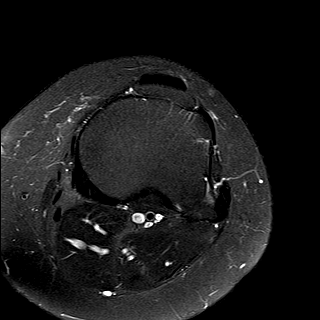
[im 14/24]
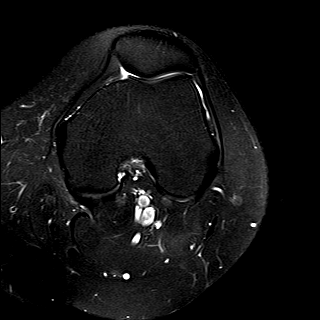
[im 24/24]
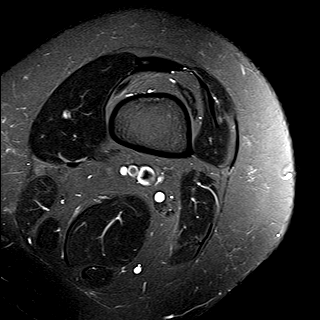

[Series 5: T2 fat-sat · coronal · 4.0mm · 0.29mm/px · 3 of 24 slices shown (2 of 2)]
[im 5/24]
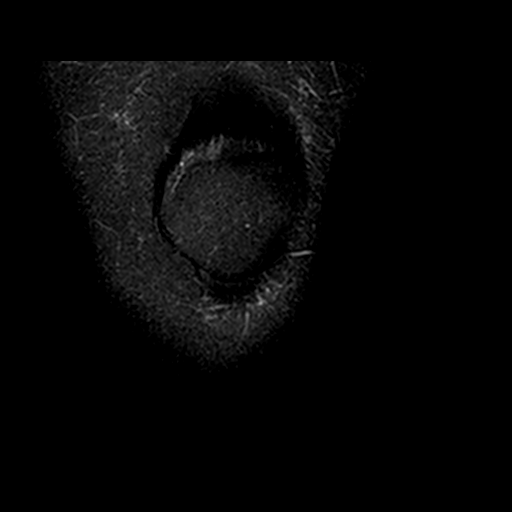
[im 14/24]
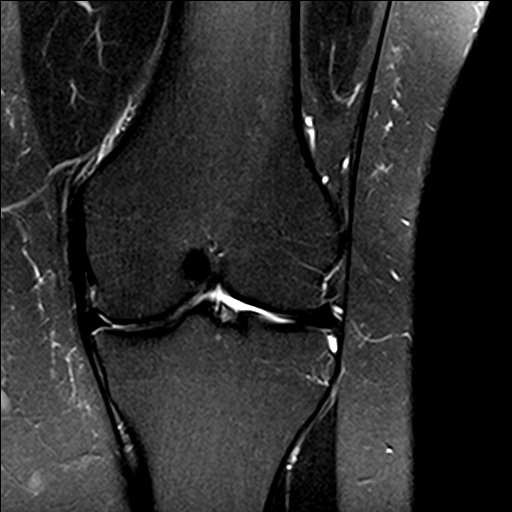
[im 24/24]
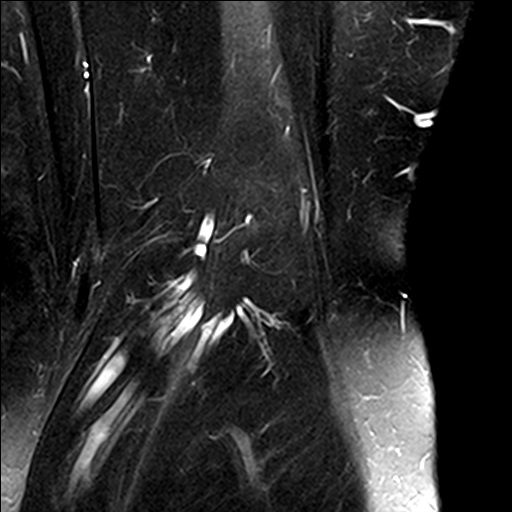

[Series 7: PD fat-sat · sagittal · 3.0mm · 0.29mm/px · 7 of 27 slices shown (1 of 2)]
[im 1/27]
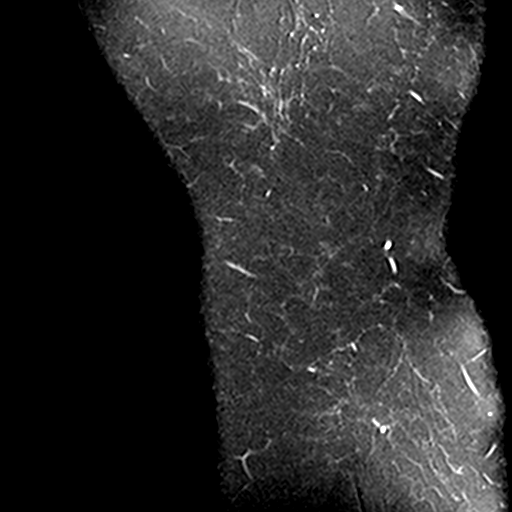
[im 5/27]
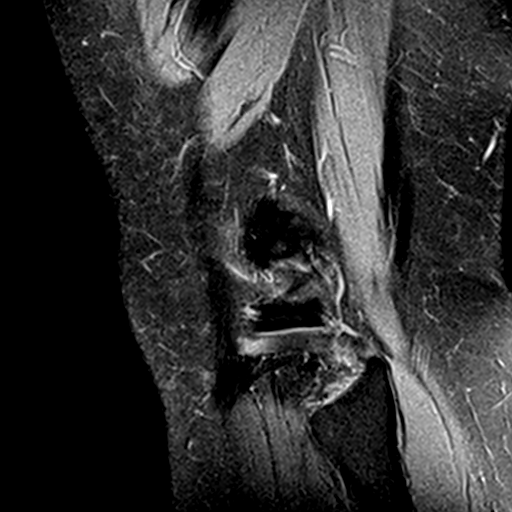
[im 9/27]
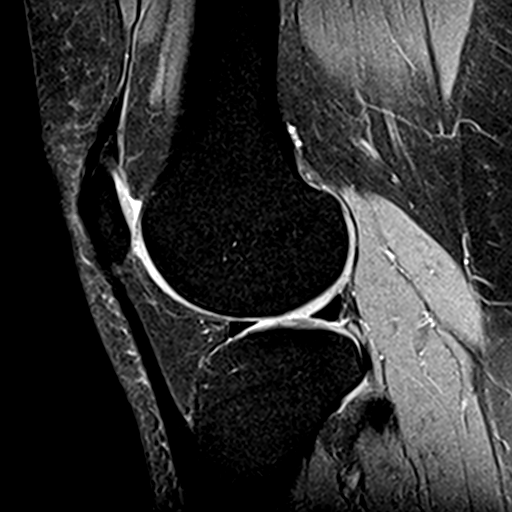
[im 14/27]
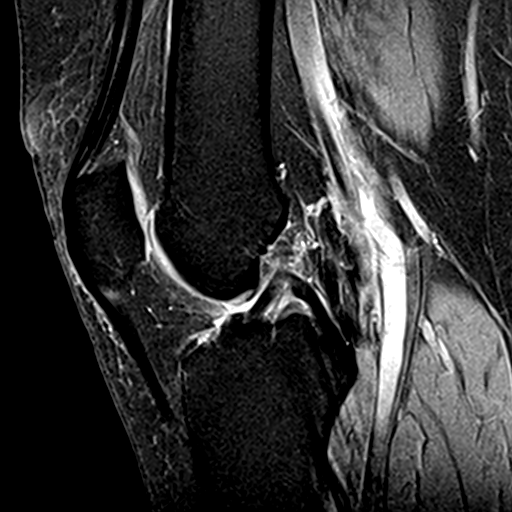
[im 18/27]
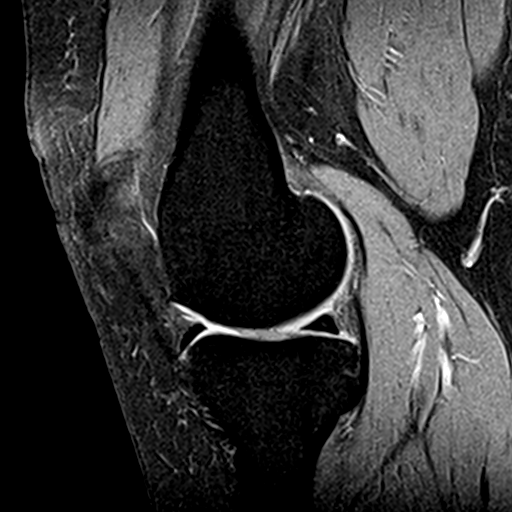
[im 22/27]
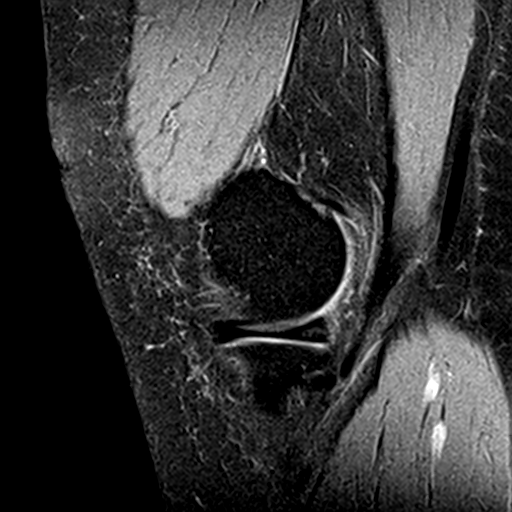
[im 27/27]
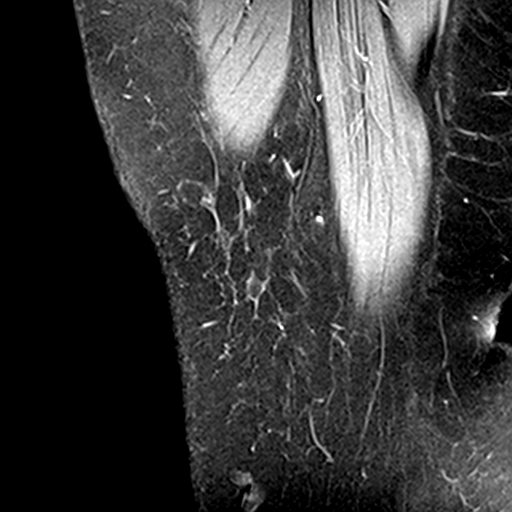

[Series 8: PD fat-sat · coronal · 3.0mm · 0.29mm/px · 8 of 31 slices shown (2 of 2)]
[im 1/31]
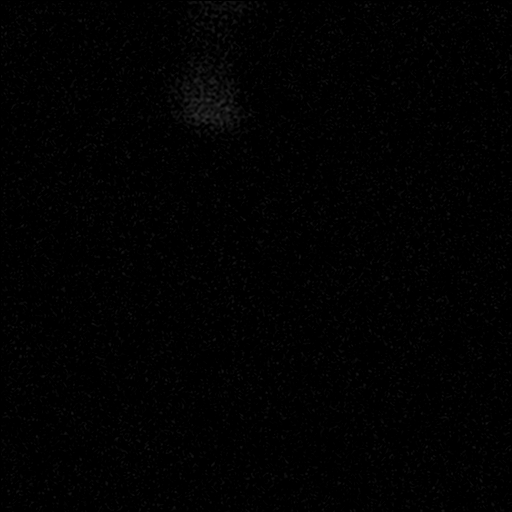
[im 5/31]
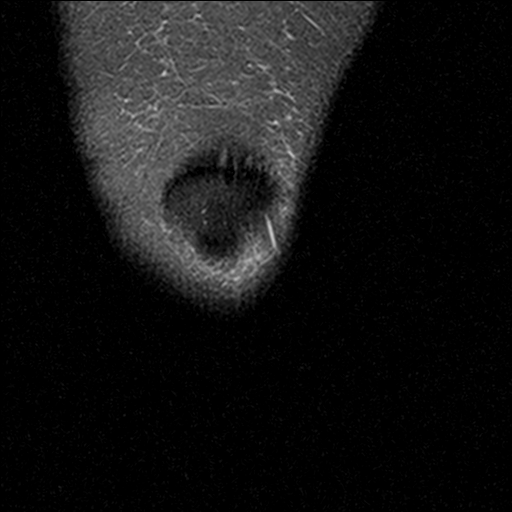
[im 9/31]
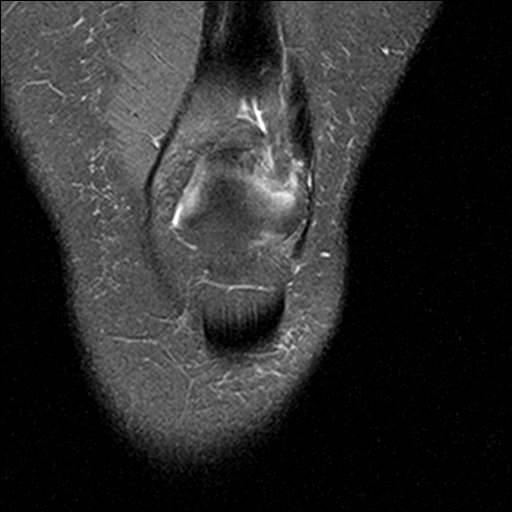
[im 13/31]
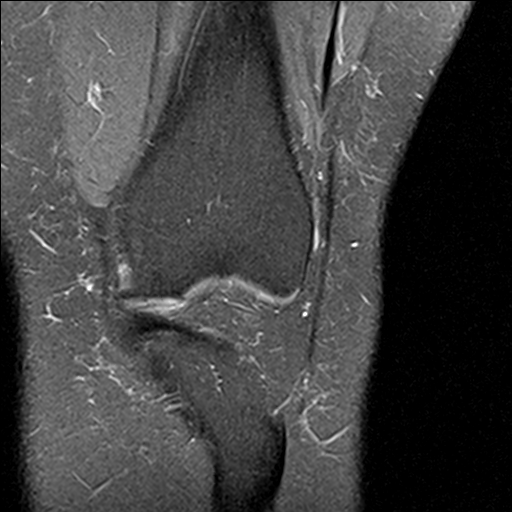
[im 18/31]
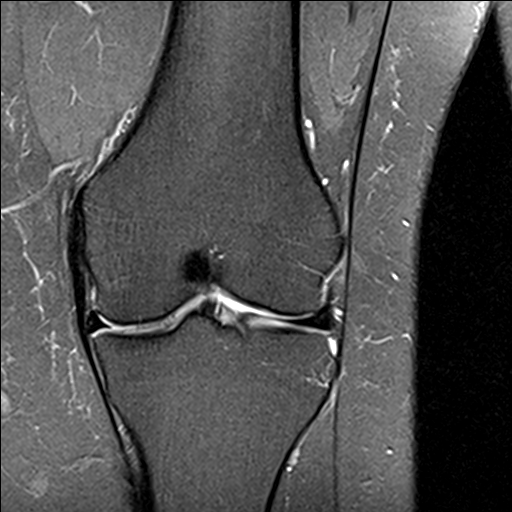
[im 22/31]
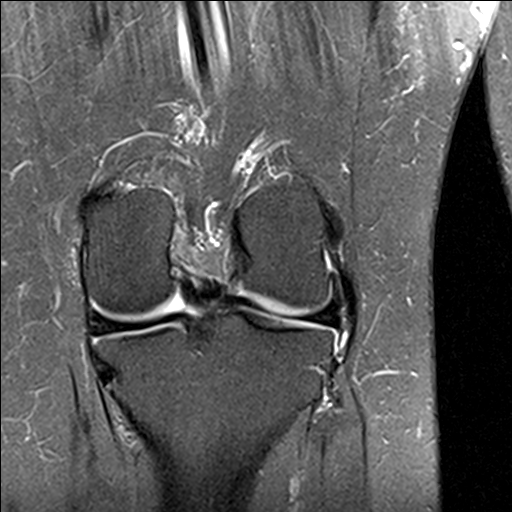
[im 26/31]
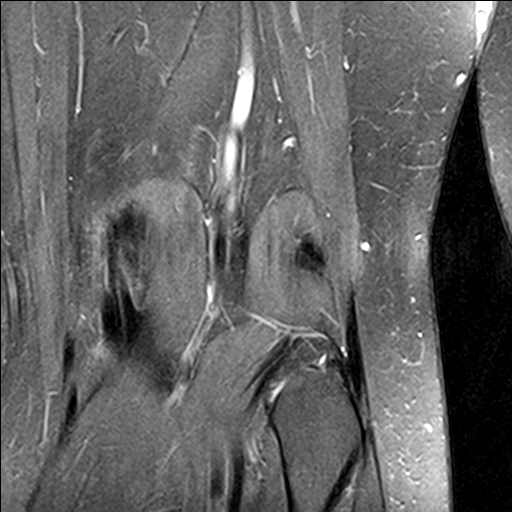
[im 31/31]
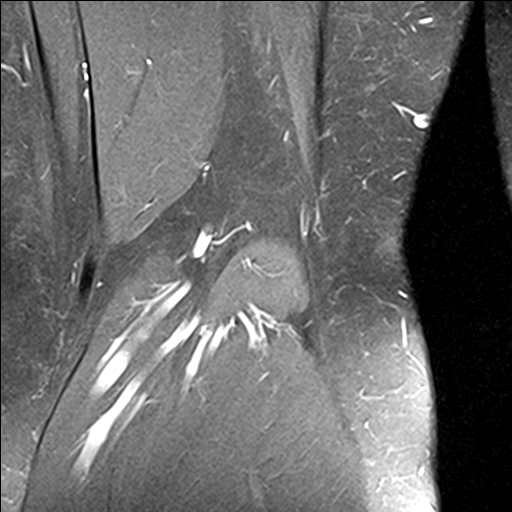

[21 of 40 positions shown; findings below may reference images not displayed]

FINDINGS: MENISCI

Medial meniscus:  Intact.

Lateral meniscus:  Intact.

LIGAMENTS

Cruciates:  Intact ACL and PCL.

Collaterals: Medial collateral ligament is intact. Lateral
collateral ligament complex is intact.

CARTILAGE

Patellofemoral:  No chondral defect.

Medial: Mild chondral thinning of the weight-bearing medial
compartment without focal defect.

Lateral:  No chondral defect.

Joint:  No joint effusion.  Unremarkable fat pads.

Popliteal Fossa:  No Baker cyst. Intact popliteus tendon.

Extensor Mechanism:  Intact quadriceps tendon and patellar tendon.

Bones: Tiny medial compartment marginal osteophytosis. No focal
marrow signal abnormality. No fracture or dislocation.

Other: None.
IMPRESSION: 1. No internal derangement of the left knee.
2. Minimal medial compartment osteoarthritis.

## 2021-02-01 ENCOUNTER — Other Ambulatory Visit: Payer: Self-pay | Admitting: Orthopedic Surgery

## 2021-02-01 DIAGNOSIS — M25561 Pain in right knee: Secondary | ICD-10-CM

## 2021-02-21 ENCOUNTER — Other Ambulatory Visit: Payer: Self-pay

## 2021-02-21 ENCOUNTER — Ambulatory Visit
Admission: RE | Admit: 2021-02-21 | Discharge: 2021-02-21 | Disposition: A | Discharge status: Home / Self Care | Payer: BC Managed Care – PPO | Source: Ambulatory Visit | Attending: Orthopedic Surgery | Admitting: Orthopedic Surgery

## 2021-02-21 DIAGNOSIS — M25561 Pain in right knee: Secondary | ICD-10-CM

## 2021-02-21 IMAGING — MR MR KNEE*R* W/O CM
4 of 6 series · 22 of 40 positions shown · non-contrast
Comparison: Radiographs from [DATE]

CLINICAL DATA: Right medial knee pain for 3 months

EXAM:
MRI OF THE RIGHT KNEE WITHOUT CONTRAST
TECHNIQUE: Multiplanar, multisequence MR imaging of the knee was performed. No
intravenous contrast was administered.

[Series 3: T2 fat-sat · axial · 4.0mm · 0.50mm/px · z∈[-68,+27]mm · 5 of 24 slices shown (1 of 2)]
[im 1/24]
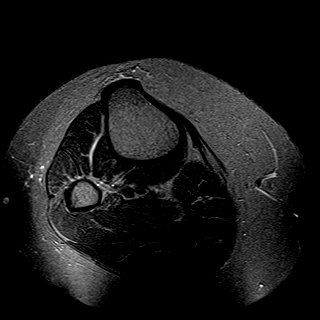
[im 4/24]
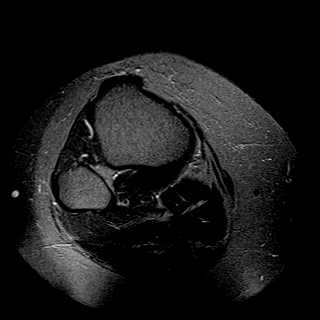
[im 8/24]
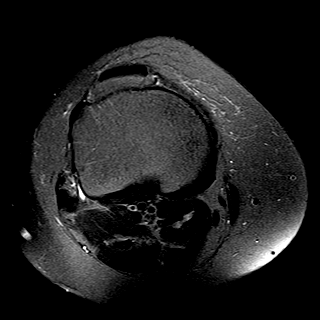
[im 12/24]
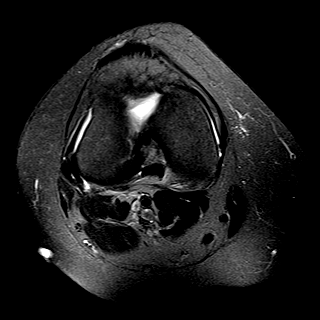
[im 20/24]
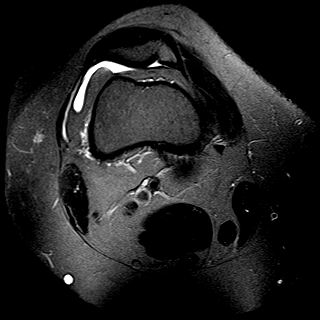

[Series 5: T2 fat-sat · coronal · 4.0mm · 0.29mm/px · 3 of 22 slices shown (2 of 2)]
[im 5/22]
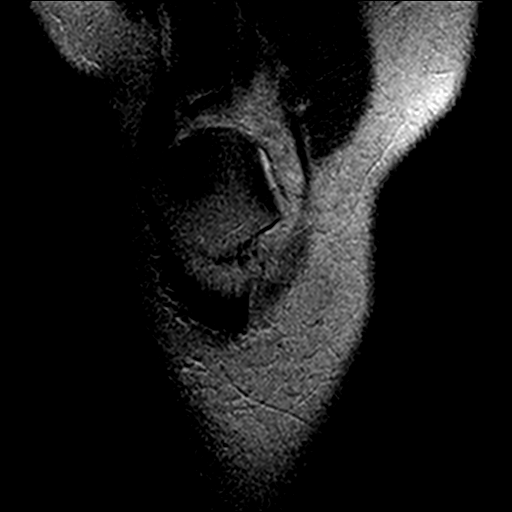
[im 13/22]
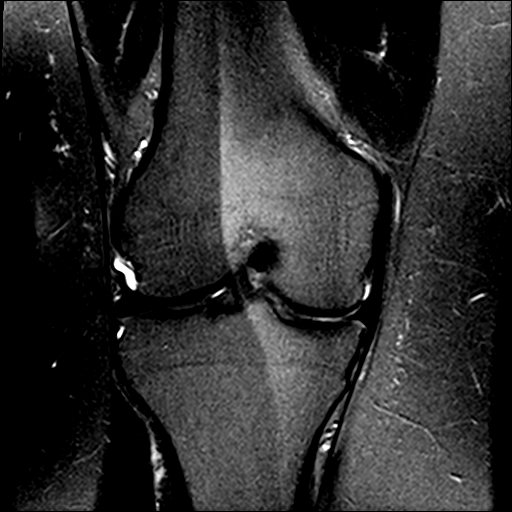
[im 22/22]
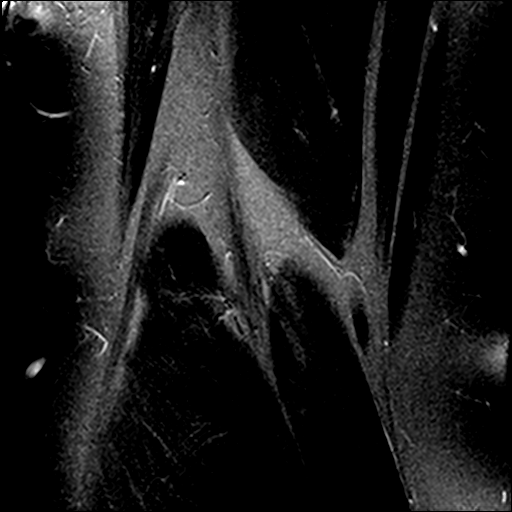

[Series 7: PD fat-sat · sagittal · 3.0mm · 0.29mm/px · 7 of 27 slices shown (1 of 2)]
[im 1/27]
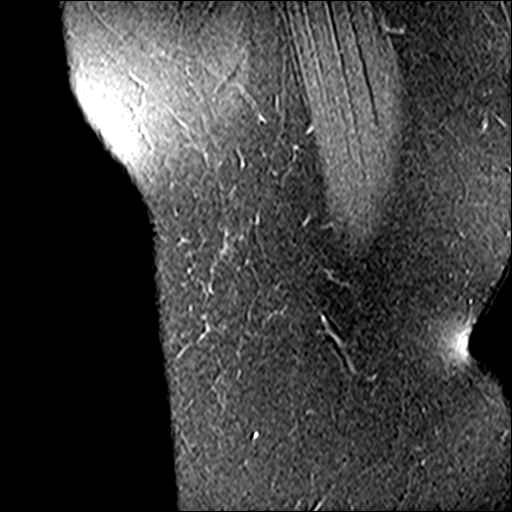
[im 5/27]
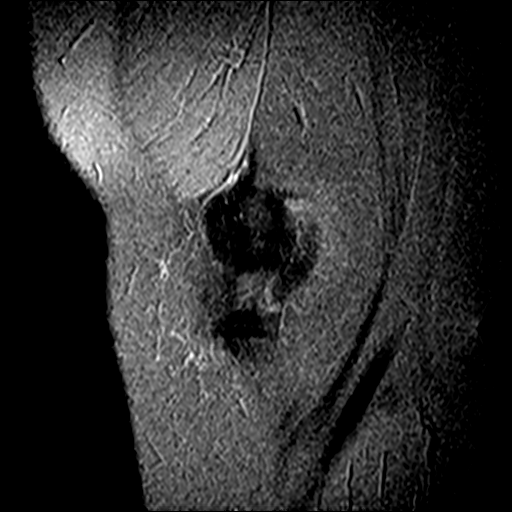
[im 9/27]
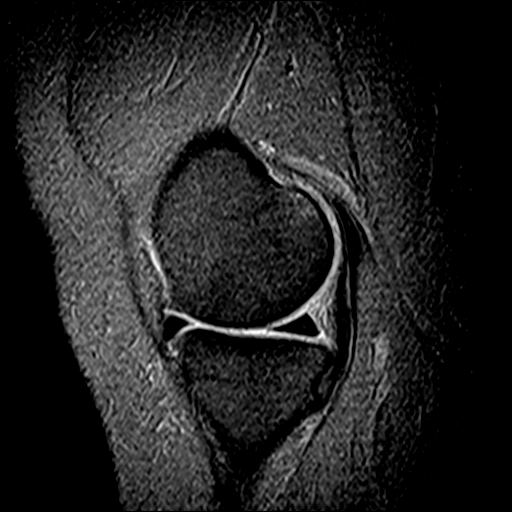
[im 14/27]
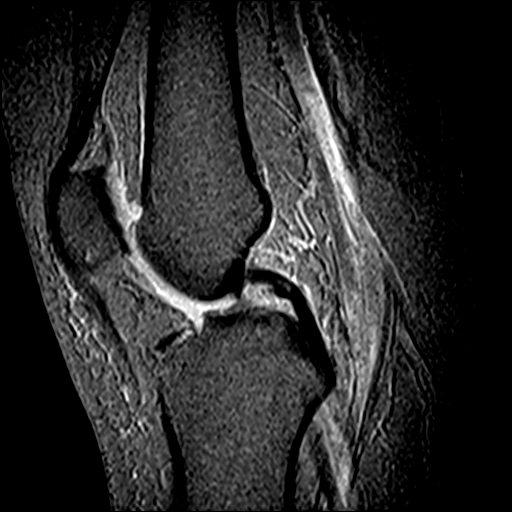
[im 18/27]
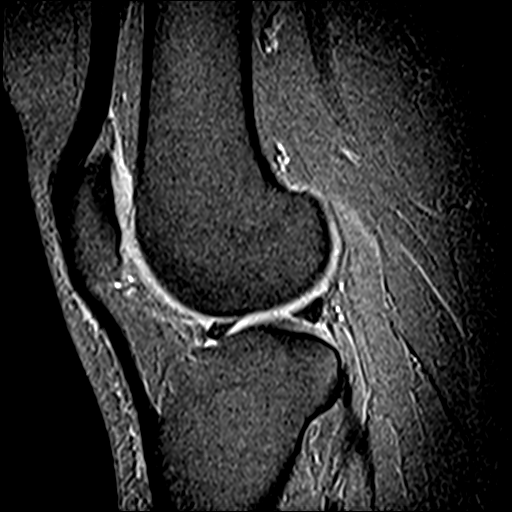
[im 22/27]
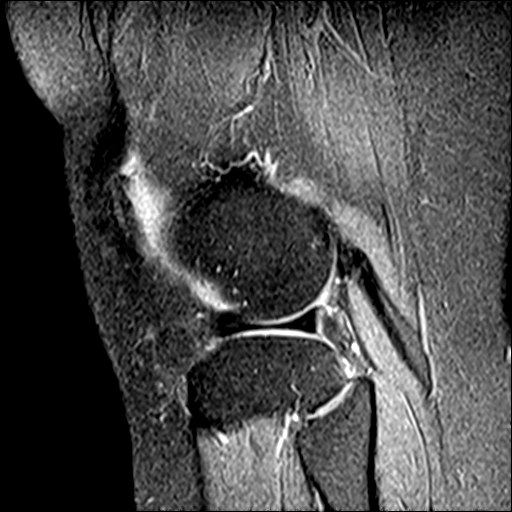
[im 27/27]
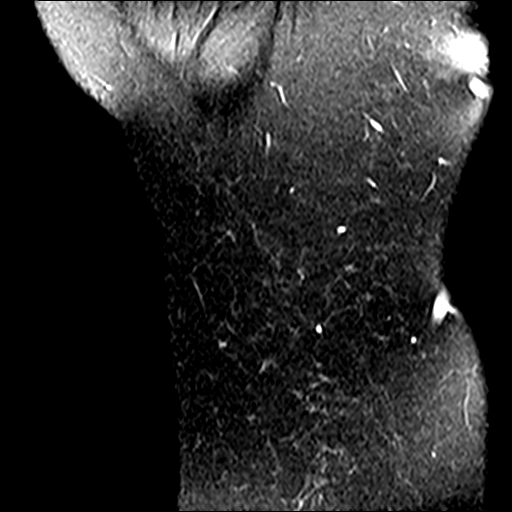

[Series 8: PD fat-sat · coronal · 3.0mm · 0.29mm/px · 7 of 28 slices shown (2 of 2)]
[im 1/28]
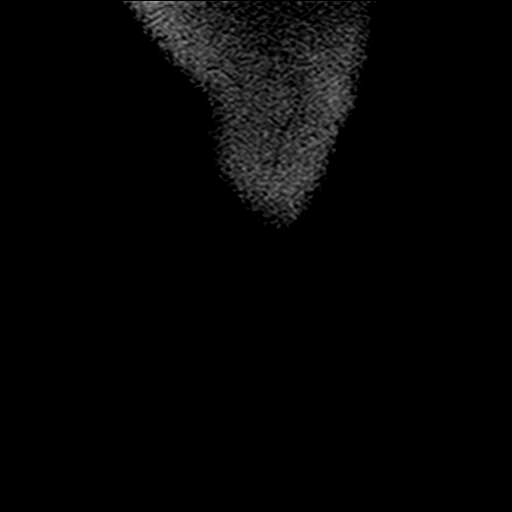
[im 5/28]
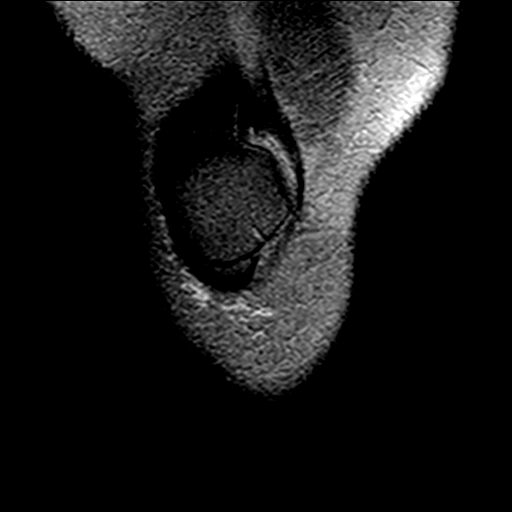
[im 10/28]
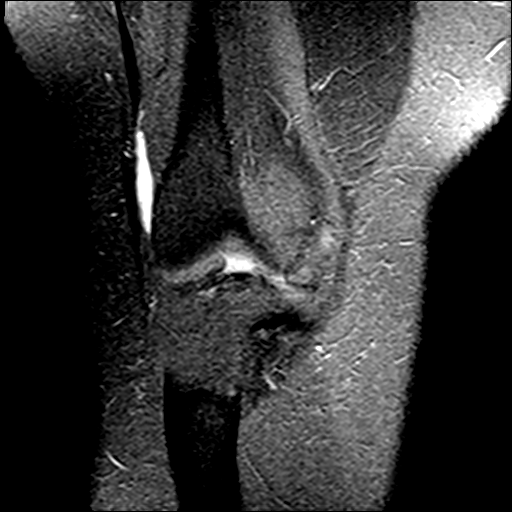
[im 14/28]
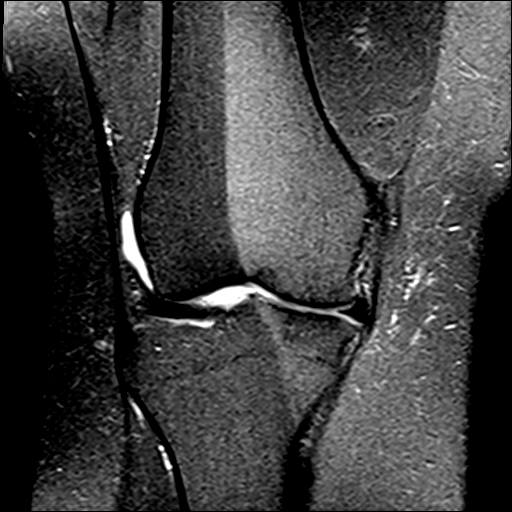
[im 19/28]
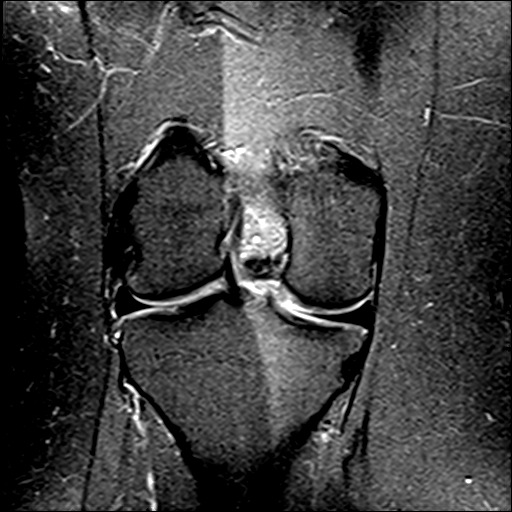
[im 23/28]
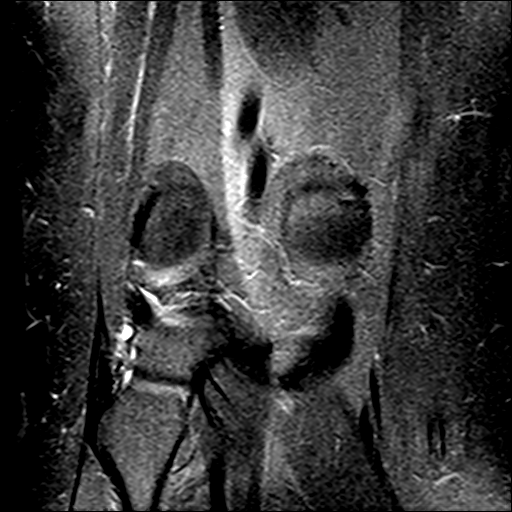
[im 28/28]
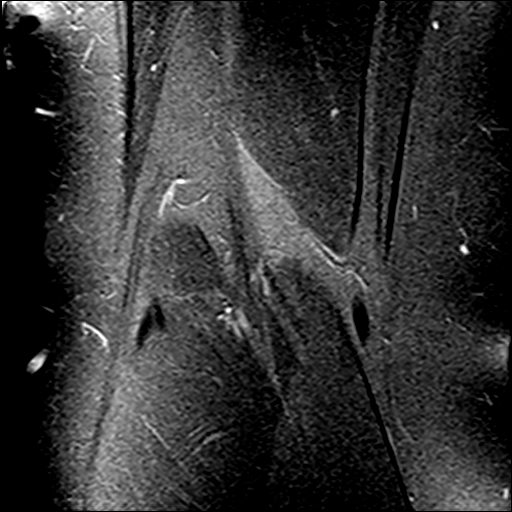

[22 of 40 positions shown; findings below may reference images not displayed]

FINDINGS: MENISCI

Medial meniscus:  Unremarkable

Lateral meniscus:  Unremarkable

LIGAMENTS

Cruciates:  Unremarkable

Collaterals:  Unremarkable

CARTILAGE

Patellofemoral: Mild to moderate chondral thinning inferiorly along
the femoral trochlear groove and inferiorly along the posterior
patellar ridge.

Medial: Mild degenerative chondral thinning. Mild marginal spurring.

Lateral:  Minimal marginal spurring.

Joint: Small knee effusion. Thickened medial plica. There is a band
of low signal partially along the infrapatellar plica, but appearing
somewhat thickened.

Popliteal Fossa:  Unremarkable

Extensor Mechanism:  Unremarkable

Bones: Subtle marrow edema along the proximal posterior portion of
the medial femoral condyle on image 20 series 7.

Other: No supplemental non-categorized findings.
IMPRESSION: 1. Mild osteoarthritis the knee.
2. Small knee effusion with a thickened medial plica. There is also
some low signal intensity thickening along the infrapatellar plica.
3. Small focus of marrow edema along the proximal posterior portion
of the medial femoral condyle, potentially degenerative or due to a
small non-fragmented osteochondral lesion.

## 2021-03-29 ENCOUNTER — Encounter (HOSPITAL_BASED_OUTPATIENT_CLINIC_OR_DEPARTMENT_OTHER): Payer: Self-pay | Admitting: Emergency Medicine

## 2021-03-29 ENCOUNTER — Other Ambulatory Visit: Payer: Self-pay

## 2021-03-29 ENCOUNTER — Emergency Department (HOSPITAL_BASED_OUTPATIENT_CLINIC_OR_DEPARTMENT_OTHER)
Admission: EM | Admit: 2021-03-29 | Discharge: 2021-03-29 | Disposition: A | Discharge status: Home / Self Care | Payer: BC Managed Care – PPO | Attending: Emergency Medicine | Admitting: Emergency Medicine

## 2021-03-29 ENCOUNTER — Emergency Department (HOSPITAL_BASED_OUTPATIENT_CLINIC_OR_DEPARTMENT_OTHER): Payer: BC Managed Care – PPO

## 2021-03-29 DIAGNOSIS — K529 Noninfective gastroenteritis and colitis, unspecified: Secondary | ICD-10-CM | POA: Diagnosis not present

## 2021-03-29 DIAGNOSIS — K219 Gastro-esophageal reflux disease without esophagitis: Secondary | ICD-10-CM | POA: Diagnosis not present

## 2021-03-29 DIAGNOSIS — R1032 Left lower quadrant pain: Secondary | ICD-10-CM | POA: Diagnosis present

## 2021-03-29 LAB — CBC WITH DIFFERENTIAL/PLATELET
Abs Immature Granulocytes: 0.04 10*3/uL (ref 0.00–0.07)
Basophils Absolute: 0 10*3/uL (ref 0.0–0.1)
Basophils Relative: 0 %
Eosinophils Absolute: 0.1 10*3/uL (ref 0.0–0.5)
Eosinophils Relative: 1 %
HCT: 45.8 % (ref 36.0–46.0)
Hemoglobin: 16 g/dL — ABNORMAL HIGH (ref 12.0–15.0)
Immature Granulocytes: 0 %
Lymphocytes Relative: 17 %
Lymphs Abs: 2.1 10*3/uL (ref 0.7–4.0)
MCH: 29.5 pg (ref 26.0–34.0)
MCHC: 34.9 g/dL (ref 30.0–36.0)
MCV: 84.5 fL (ref 80.0–100.0)
Monocytes Absolute: 0.8 10*3/uL (ref 0.1–1.0)
Monocytes Relative: 7 %
Neutro Abs: 9.2 10*3/uL — ABNORMAL HIGH (ref 1.7–7.7)
Neutrophils Relative %: 75 %
Platelets: 301 10*3/uL (ref 150–400)
RBC: 5.42 MIL/uL — ABNORMAL HIGH (ref 3.87–5.11)
RDW: 11.9 % (ref 11.5–15.5)
WBC: 12.2 10*3/uL — ABNORMAL HIGH (ref 4.0–10.5)
nRBC: 0 % (ref 0.0–0.2)

## 2021-03-29 LAB — COMPREHENSIVE METABOLIC PANEL
ALT: 48 U/L — ABNORMAL HIGH (ref 0–44)
AST: 25 U/L (ref 15–41)
Albumin: 4.7 g/dL (ref 3.5–5.0)
Alkaline Phosphatase: 36 U/L — ABNORMAL LOW (ref 38–126)
Anion gap: 10 (ref 5–15)
BUN: 13 mg/dL (ref 6–20)
CO2: 26 mmol/L (ref 22–32)
Calcium: 9.6 mg/dL (ref 8.9–10.3)
Chloride: 101 mmol/L (ref 98–111)
Creatinine, Ser: 0.72 mg/dL (ref 0.44–1.00)
GFR, Estimated: >60 mL/min (ref >60)
Glucose, Bld: 97 mg/dL (ref 70–99)
Potassium: 4.2 mmol/L (ref 3.5–5.1)
Sodium: 137 mmol/L (ref 135–145)
Total Bilirubin: 1 mg/dL (ref 0.3–1.2)
Total Protein: 8.1 g/dL (ref 6.5–8.1)

## 2021-03-29 LAB — HCG, SERUM, QUALITATIVE: Preg, Serum: NEGATIVE

## 2021-03-29 LAB — LIPASE, BLOOD: Lipase: 48 U/L (ref 11–51)

## 2021-03-29 LAB — URINALYSIS, ROUTINE W REFLEX MICROSCOPIC
Bilirubin Urine: NEGATIVE
Glucose, UA: NEGATIVE mg/dL
Hgb urine dipstick: NEGATIVE
Ketones, ur: NEGATIVE mg/dL
Leukocytes,Ua: NEGATIVE
Nitrite: NEGATIVE
Protein, ur: NEGATIVE mg/dL
Specific Gravity, Urine: 1.015 (ref 1.005–1.030)
pH: 7 (ref 5.0–8.0)

## 2021-03-29 IMAGING — CT CT ABD-PELV W/ CM
2 of 4 series · 16 of 46 positions shown, 18 images · IV contrast (Omnipaque)
Comparison: No priors.

CLINICAL DATA: 40-year-old female with history of left lower
quadrant abdominal pain.

EXAM:
CT ABDOMEN AND PELVIS WITH CONTRAST
TECHNIQUE: Multidetector CT imaging of the abdomen and pelvis was performed
using the standard protocol following bolus administration of
intravenous contrast.
CONTRAST:  100mL OMNIPAQUE IOHEXOL 300 MG/ML  SOLN

[Series 2: axial st · axial · 0.98mm/px · z∈[+764,+1184]mm · 13 of 92 slices shown, 15 images]
[im 4/92  soft-tissue]
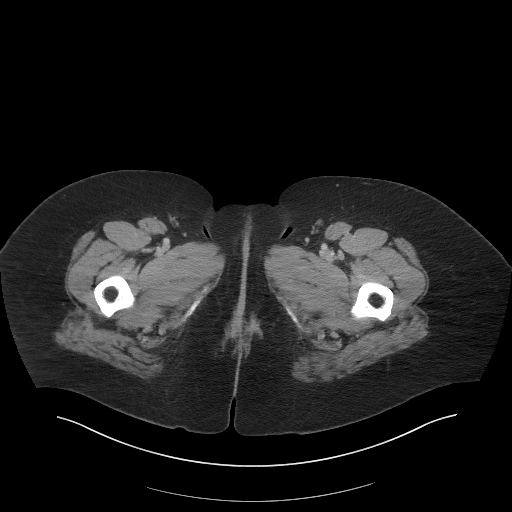
[im 4/92  bone]
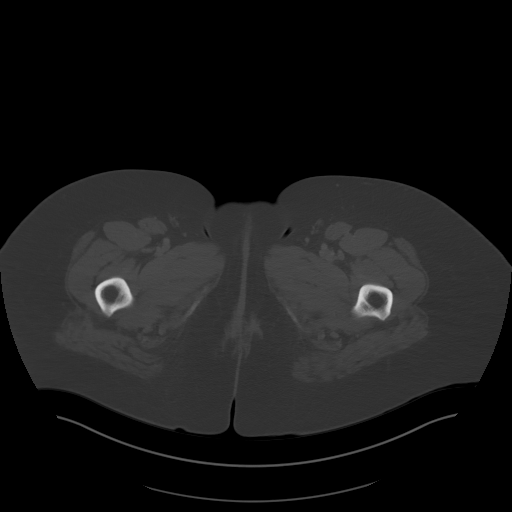
[im 11/92  soft-tissue]
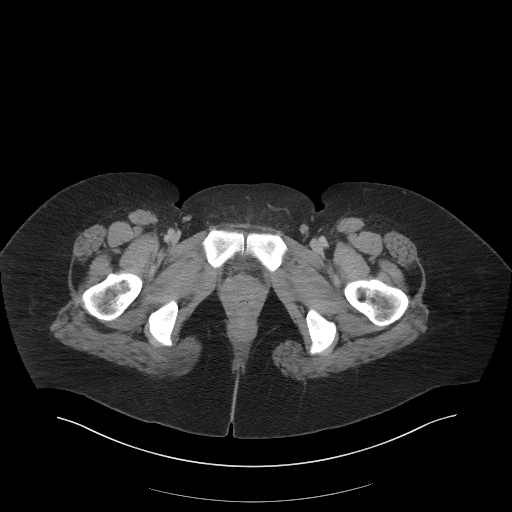
[im 19/92  soft-tissue]
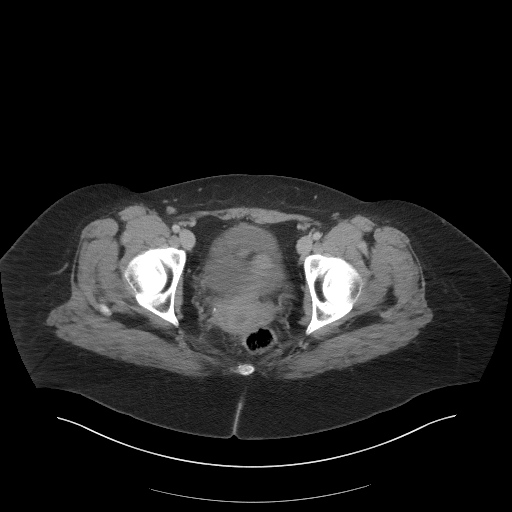
[im 26/92  soft-tissue]
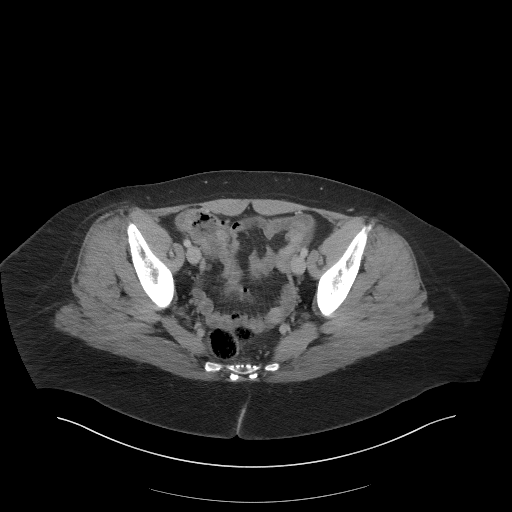
[im 33/92  soft-tissue]
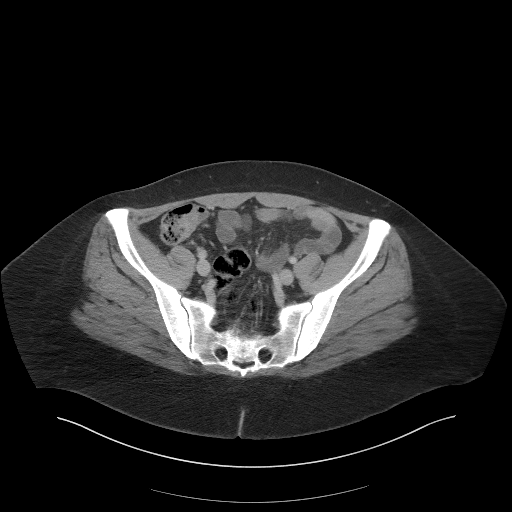
[im 41/92  soft-tissue]
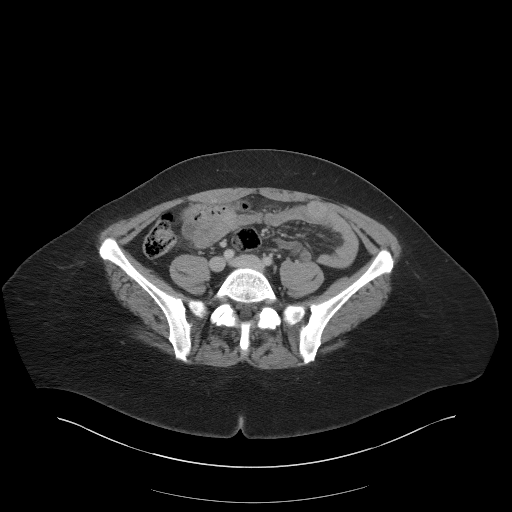
[im 48/92  soft-tissue]
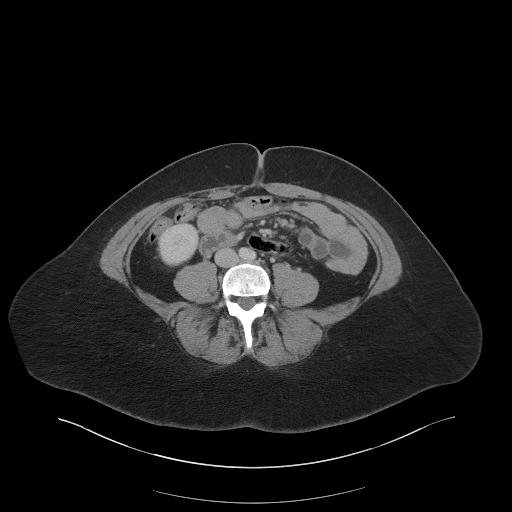
[im 51/92  soft-tissue]
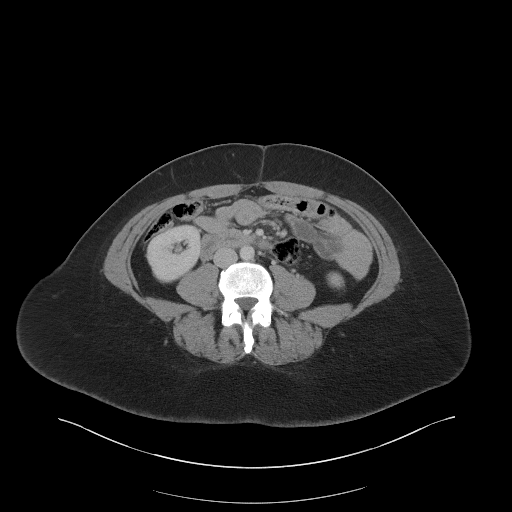
[im 59/92  soft-tissue]
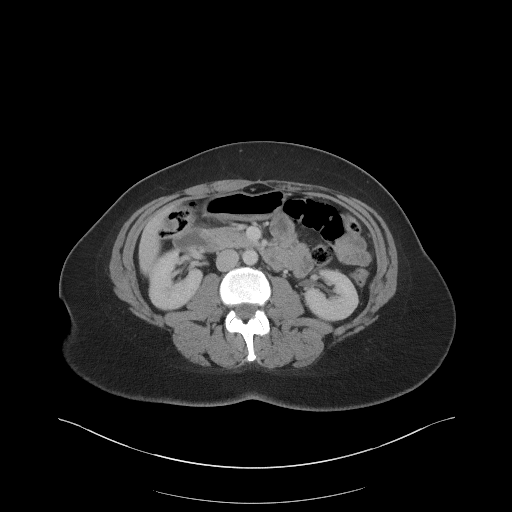
[im 59/92  bone]
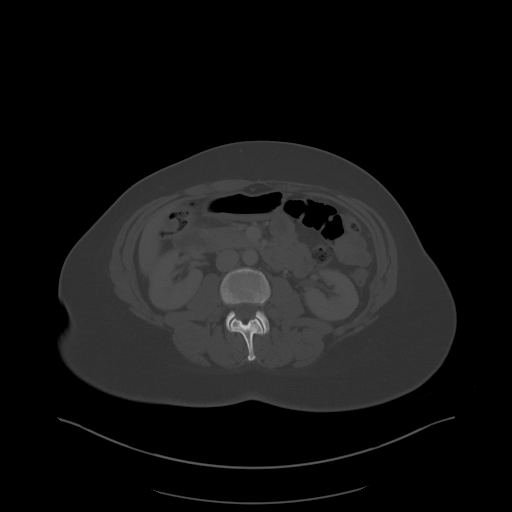
[im 66/92  soft-tissue]
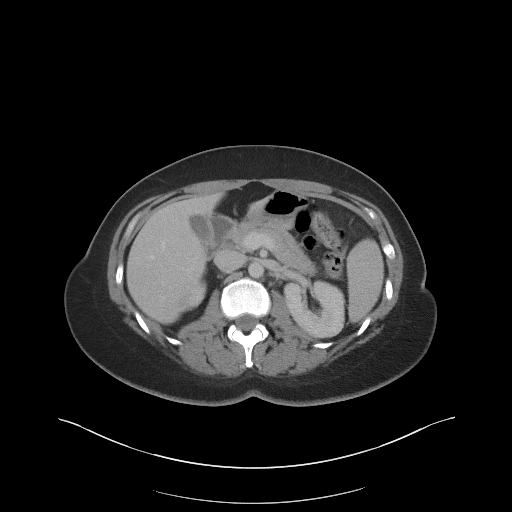
[im 73/92  soft-tissue]
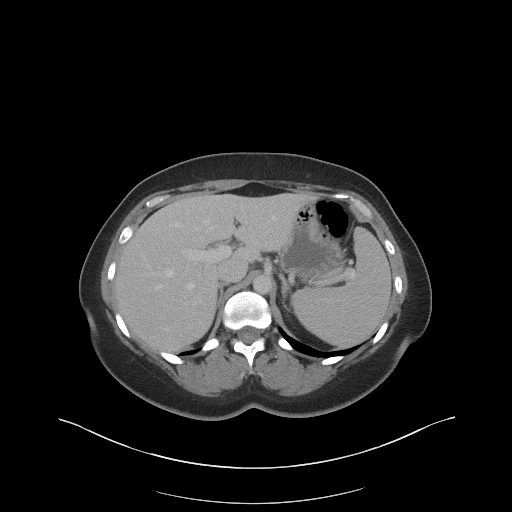
[im 81/92  soft-tissue]
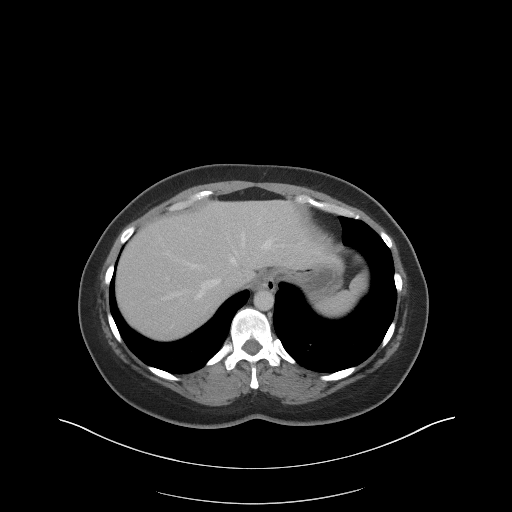
[im 88/92  soft-tissue]
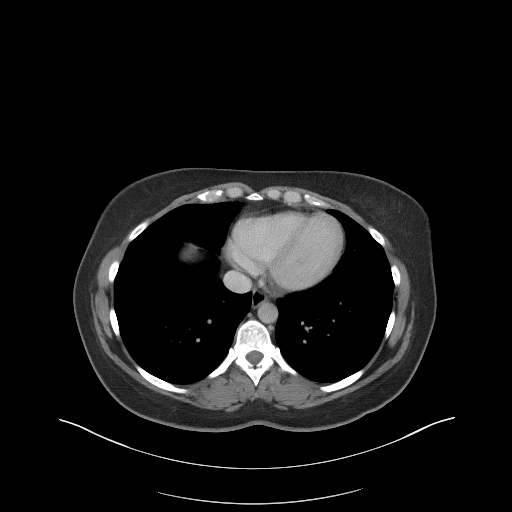

[Series 5: coronal st · coronal · 0.75mm/px · 3 of 84 slices shown]
[im 28/84  soft-tissue]
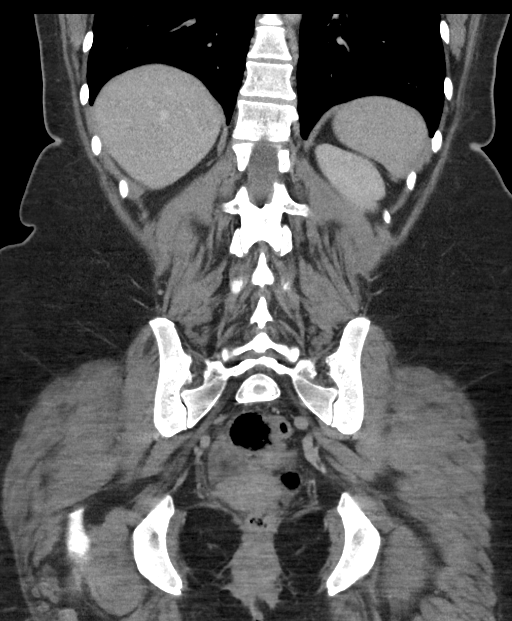
[im 37/84  soft-tissue]
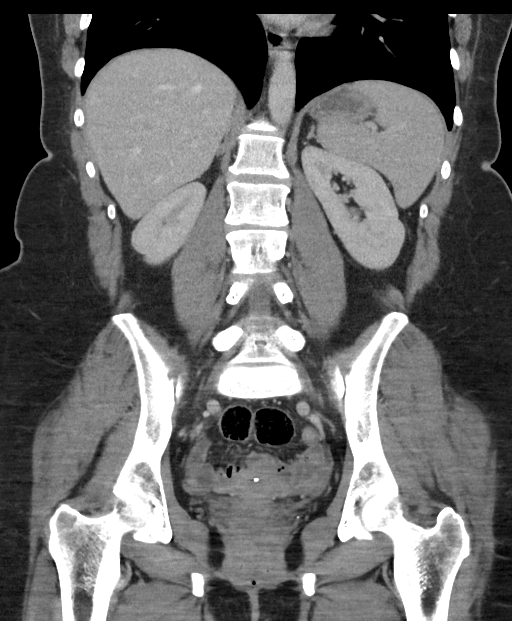
[im 47/84  soft-tissue]
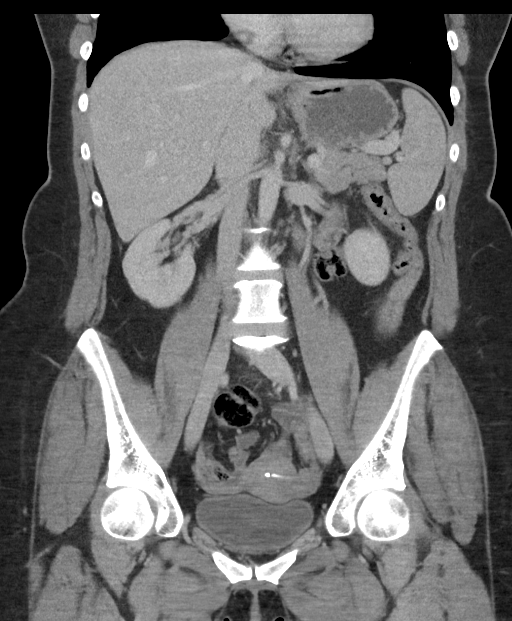

[16 of 46 positions shown; findings below may reference images not displayed]

FINDINGS: Lower chest: Small hiatal hernia.  Otherwise, unremarkable.

Hepatobiliary: No suspicious cystic or solid hepatic lesions. No
intra or extrahepatic biliary ductal dilatation. Gallbladder is
normal in appearance.

Pancreas: No pancreatic mass. No pancreatic ductal dilatation. No
pancreatic or peripancreatic fluid collections or inflammatory
changes.

Spleen: Unremarkable.

Adrenals/Urinary Tract: Bilateral kidneys and adrenal glands are
normal in appearance. No hydroureteronephrosis. Urinary bladder is
normal in appearance.

Stomach/Bowel: The appearance of the stomach is normal. There is no
pathologic dilatation of small bowel or colon. The appendix is not
confidently identified and may be surgically absent. Regardless,
there are no inflammatory changes noted adjacent to the cecum to
suggest the presence of an acute appendicitis at this time.

Vascular/Lymphatic: Atherosclerotic calcifications in the pelvic
vasculature. No aneurysm or dissection noted in the abdominal or
pelvic vasculature. No lymphadenopathy noted in the abdomen or
pelvis.

Reproductive: IUD present in the endometrial canal. Uterus and
ovaries are otherwise unremarkable in appearance.

Other: No significant volume of ascites noted in the visualized
portions of the peritoneal cavity.

Musculoskeletal: There are no aggressive appearing lytic or blastic
lesions noted in the visualized portions of the skeleton.
IMPRESSION: 1. No acute findings are noted in the abdomen or pelvis to account
for the patient's symptoms.
2. Atherosclerosis.
3. Small hiatal hernia.
4. Additional incidental findings, as above.

## 2021-03-29 MED ORDER — IOHEXOL 300 MG/ML  SOLN
100.0000 mL | Freq: Once | INTRAMUSCULAR | Status: AC | PRN
  Administered 2021-03-29: 100 mL via INTRAVENOUS

## 2021-03-29 MED ORDER — LACTATED RINGERS IV BOLUS
1000.0000 mL | Freq: Once | INTRAVENOUS | Status: AC
  Administered 2021-03-29: 1000 mL via INTRAVENOUS

## 2021-03-29 MED ORDER — ONDANSETRON HCL 4 MG/2ML IJ SOLN
4.0000 mg | Freq: Once | INTRAMUSCULAR | Status: AC
  Administered 2021-03-29: 4 mg via INTRAVENOUS
  Filled 2021-03-29: qty 2

## 2021-03-29 MED ORDER — METOCLOPRAMIDE HCL 5 MG/ML IJ SOLN: 10.0000 mg | Freq: Once | INTRAMUSCULAR | Status: DC | PRN

## 2021-03-29 MED ORDER — DICYCLOMINE HCL 10 MG/ML IM SOLN
20.0000 mg | Freq: Once | INTRAMUSCULAR | Status: AC
  Administered 2021-03-29: 20 mg via INTRAMUSCULAR
  Filled 2021-03-29: qty 2

## 2021-03-29 MED ORDER — DICYCLOMINE HCL 20 MG PO TABS: 20.0000 mg | ORAL_TABLET | Freq: Four times a day (QID) | ORAL | 0 refills | Status: AC | PRN

## 2021-03-29 MED ORDER — ONDANSETRON 8 MG PO TBDP: 8.0000 mg | ORAL_TABLET | Freq: Three times a day (TID) | ORAL | 1 refills | Status: AC | PRN

## 2021-03-29 MED ORDER — FENTANYL CITRATE PF 50 MCG/ML IJ SOSY
100.0000 ug | PREFILLED_SYRINGE | Freq: Once | INTRAMUSCULAR | Status: AC
  Administered 2021-03-29: 100 ug via INTRAVENOUS
  Filled 2021-03-29: qty 2

## 2021-03-29 NOTE — ED Triage Notes (Signed)
fPt is c/o left lower abd pain that started about 4 hrs ago  Pt has had nausea, vomiting, and diarrhea associated with the pain  Pt states the pain will come and then she will throw up

## 2021-03-29 NOTE — ED Provider Notes (Signed)
[facility] Provider Note: Lowella Dell, MD, FACEP  CSN: [id number] MRN: [id number] ARRIVAL: 03/29/21 at 0407 ROOM: [facility]   CHIEF COMPLAINT  Abdominal Pain   HISTORY OF PRESENT ILLNESS  03/29/21 4:24 AM Jordan Harris is a 40 y.o. female with left lower abdominal pain that began about 4 hours ago.  She rates the pain as an 8 out of 10 and describes it as sharp.  It has been associated with nausea, vomiting and diarrhea.  She has not had a fever.  Vomiting helps relieve the pain.  She estimates she has vomited 20 times.  She tried to take Zofran at home but came back up.   Past Medical History:  Diagnosis Date   Anxiety    Depression    GERD (gastroesophageal reflux disease)    Obesity     Past Surgical History:  Procedure Laterality Date   arthroscopic knee surgery     TONSILLECTOMY AND ADENOIDECTOMY  09/21/2004    Family History  Problem Relation Age of Onset   Heart disease Father    Prostate cancer Father    Cancer Father        prostate   Pancreatic cancer Paternal Grandmother    Lung cancer Mother    Kidney cancer Mother    Cancer Mother        lung,kidney   Cancer Paternal Grandfather        colon    Social History   Tobacco Use   Smoking status: Never   Smokeless tobacco: Never  Vaping Use   Vaping Use: Former  Substance Use Topics   Alcohol use: Yes    Comment: 2 glasses of wine per week   Drug use: No    Prior to Admission medications   Medication Sig Start Date End Date Taking? Authorizing Provider  dicyclomine (BENTYL) 20 MG tablet Take 1 tablet (20 mg total) by mouth 4 (four) times daily as needed (for abdominal cramping). 03/29/21  Yes Ahan Eisenberger, MD  ondansetron (ZOFRAN-ODT) 8 MG disintegrating tablet Take 1 tablet (8 mg total) by mouth every 8 (eight) hours as needed for nausea or vomiting. 8mg  ODT q4 hours prn nausea 03/29/21  Yes Mackenze Grandison, MD  FLUoxetine (PROZAC) 40 MG capsule Take 40 mg by mouth daily.    [provider]    Allergies Patient has no known allergies.   REVIEW OF SYSTEMS  Negative except as noted here or in the History of Present Illness.   PHYSICAL EXAMINATION  Initial Vital Signs Blood pressure 122/85, pulse (!) 107, temperature 98.4 F (36.9 C), temperature source Oral, resp. rate 20, height 5\' 7"  (1.702 m), weight 99.8 kg, SpO2 100 %.  Examination General: Well-developed, well-nourished female in no acute distress; appearance consistent with age of record HENT: normocephalic; atraumatic Eyes: Normal appearance Neck: supple Heart: regular rate and rhythm; tachycardia Lungs: clear to auscultation bilaterally Abdomen: soft; nondistended; left lower quadrant tenderness with lesser epigastric tenderness; bowel sounds present Extremities: No deformity; full range of motion; pulses normal Neurologic: Awake, alert and oriented; motor function intact in all extremities and symmetric; no facial droop Skin: Warm and dry Psychiatric: Flat affect   RESULTS  Summary of this visit's results, reviewed and interpreted by myself:   EKG Interpretation  Date/Time:    Ventricular Rate:    PR Interval:    QRS Duration:   QT Interval:    QTC Calculation:   R Axis:     Text Interpretation:  Laboratory Studies: Results for orders placed or performed during the hospital encounter of 03/29/21 (from the past 24 hour(s))  Lipase, blood     Status: None   Collection Time: 03/29/21  4:24 AM  Result Value Ref Range   Lipase 48 11 - 51 U/L  Comprehensive metabolic panel     Status: Abnormal   Collection Time: 03/29/21  4:24 AM  Result Value Ref Range   Sodium 137 135 - 145 mmol/L   Potassium 4.2 3.5 - 5.1 mmol/L   Chloride 101 98 - 111 mmol/L   CO2 26 22 - 32 mmol/L   Glucose, Bld 97 70 - 99 mg/dL   BUN 13 6 - 20 mg/dL   Creatinine, Ser 7.32 0.44 - 1.00 mg/dL   Calcium 9.6 8.9 - 20.2 mg/dL   Total Protein 8.1 6.5 - 8.1 g/dL   Albumin 4.7 3.5 - 5.0 g/dL   AST 25  15 - 41 U/L   ALT 48 (H) 0 - 44 U/L   Alkaline Phosphatase 36 (L) 38 - 126 U/L   Total Bilirubin 1.0 0.3 - 1.2 mg/dL   GFR, Estimated >54 >27 mL/min   Anion gap 10 5 - 15  hCG, serum, qualitative     Status: None   Collection Time: 03/29/21  4:24 AM  Result Value Ref Range   Preg, Serum NEGATIVE NEGATIVE  CBC with Differential/Platelet     Status: Abnormal   Collection Time: 03/29/21  4:24 AM  Result Value Ref Range   WBC 12.2 (H) 4.0 - 10.5 K/uL   RBC 5.42 (H) 3.87 - 5.11 MIL/uL   Hemoglobin 16.0 (H) 12.0 - 15.0 g/dL   HCT 06.2 37.6 - 28.3 %   MCV 84.5 80.0 - 100.0 fL   MCH 29.5 26.0 - 34.0 pg   MCHC 34.9 30.0 - 36.0 g/dL   RDW 15.1 76.1 - 60.7 %   Platelets 301 150 - 400 K/uL   nRBC 0.0 0.0 - 0.2 %   Neutrophils Relative % 75 %   Neutro Abs 9.2 (H) 1.7 - 7.7 K/uL   Lymphocytes Relative 17 %   Lymphs Abs 2.1 0.7 - 4.0 K/uL   Monocytes Relative 7 %   Monocytes Absolute 0.8 0.1 - 1.0 K/uL   Eosinophils Relative 1 %   Eosinophils Absolute 0.1 0.0 - 0.5 K/uL   Basophils Relative 0 %   Basophils Absolute 0.0 0.0 - 0.1 K/uL   Immature Granulocytes 0 %   Abs Immature Granulocytes 0.04 0.00 - 0.07 K/uL  Urinalysis, Routine w reflex microscopic Urine, Clean Catch     Status: Abnormal   Collection Time: 03/29/21  6:04 AM  Result Value Ref Range   Color, Urine YELLOW YELLOW   APPearance HAZY (A) CLEAR   Specific Gravity, Urine 1.015 1.005 - 1.030   pH 7.0 5.0 - 8.0   Glucose, UA NEGATIVE NEGATIVE mg/dL   Hgb urine dipstick NEGATIVE NEGATIVE   Bilirubin Urine NEGATIVE NEGATIVE   Ketones, ur NEGATIVE NEGATIVE mg/dL   Protein, ur NEGATIVE NEGATIVE mg/dL   Nitrite NEGATIVE NEGATIVE   Leukocytes,Ua NEGATIVE NEGATIVE   Imaging Studies: CT ABDOMEN PELVIS W CONTRAST  Result Date: 03/29/2021 CLINICAL DATA:  40 year old female with history of left lower quadrant abdominal pain. EXAM: CT ABDOMEN AND PELVIS WITH CONTRAST TECHNIQUE: Multidetector CT imaging of the abdomen and pelvis was  performed using the standard protocol following bolus administration of intravenous contrast. CONTRAST:  OMNIPAQUE IOHEXOL 300 MG/ML  SOLN COMPARISON:  No priors. FINDINGS: Lower chest: Small hiatal hernia.  Otherwise, unremarkable. Hepatobiliary: No suspicious cystic or solid hepatic lesions. No intra or extrahepatic biliary ductal dilatation. Gallbladder is normal in appearance. Pancreas: No pancreatic mass. No pancreatic ductal dilatation. No pancreatic or peripancreatic fluid collections or inflammatory changes. Spleen: Unremarkable. Adrenals/Urinary Tract: Bilateral kidneys and adrenal glands are normal in appearance. No hydroureteronephrosis. Urinary bladder is normal in appearance. Stomach/Bowel: The appearance of the stomach is normal. There is no pathologic dilatation of small bowel or colon. The appendix is not confidently identified and may be surgically absent. Regardless, there are no inflammatory changes noted adjacent to the cecum to suggest the presence of an acute appendicitis at this time. Vascular/Lymphatic: Atherosclerotic calcifications in the pelvic vasculature. No aneurysm or dissection noted in the abdominal or pelvic vasculature. No lymphadenopathy noted in the abdomen or pelvis. Reproductive: IUD present in the endometrial canal. Uterus and ovaries are otherwise unremarkable in appearance. Other: No significant volume of ascites noted in the visualized portions of the peritoneal cavity. Musculoskeletal: There are no aggressive appearing lytic or blastic lesions noted in the visualized portions of the skeleton. IMPRESSION: 1. No acute findings are noted in the abdomen or pelvis to account for the patient's symptoms. 2. Atherosclerosis. 3. Small hiatal hernia. 4. Additional incidental findings, as above. Electronically Signed   By: Trudie Reed M.D.   On: 03/29/2021 05:57    ED COURSE and MDM  Nursing notes, initial and subsequent vitals signs, including pulse oximetry, reviewed  and interpreted by myself.  Vitals:   03/29/21 0445 03/29/21 0500 03/29/21 0515 03/29/21 0530  BP: 106/64 111/66 111/66 112/67  Pulse: 70 72 69 70  Resp:    18  Temp:      TempSrc:      SpO2: 100% 91% 100% 97%  Weight:      Height:       Medications  metoCLOPramide (REGLAN) injection 10 mg (has no administration in time range)  dicyclomine (BENTYL) injection 20 mg (has no administration in time range)  lactated ringers bolus 1,000 mL (0 mLs Intravenous Stopped 03/29/21 0547)  ondansetron (ZOFRAN) injection 4 mg (4 mg Intravenous Given 03/29/21 0438)  fentaNYL (SUBLIMAZE) injection 100 mcg (100 mcg Intravenous Given 03/29/21 0440)  iohexol (OMNIPAQUE) 300 MG/ML solution 100 mL (100 mLs Intravenous Contrast Given 03/29/21 0540)   6:14 AM Patient advised of reassuring CT scan.  She is still having some crampy left lower quadrant pain but improved from earlier.  I suspect her symptoms are due to an acute gastroenteritis, likely viral.  We will treat with Bentyl and Zofran ODT and have her return if symptoms worsen.   PROCEDURES  Procedures   ED DIAGNOSES     ICD-10-CM   1. Gastroenteritis  K52.9          Selene Peltzer, MD 03/29/21 [phone]

## 2022-06-04 ENCOUNTER — Other Ambulatory Visit (HOSPITAL_BASED_OUTPATIENT_CLINIC_OR_DEPARTMENT_OTHER): Payer: Self-pay

## 2022-06-04 MED ORDER — SEMAGLUTIDE-WEIGHT MANAGEMENT 0.25 MG/0.5ML ~~LOC~~ SOAJ
0.2500 mg | SUBCUTANEOUS | 0 refills | Status: DC
  Filled 2022-06-04: qty 2, 28d supply, fill #0

## 2022-06-05 ENCOUNTER — Other Ambulatory Visit (HOSPITAL_BASED_OUTPATIENT_CLINIC_OR_DEPARTMENT_OTHER): Payer: Self-pay

## 2022-06-08 ENCOUNTER — Other Ambulatory Visit (HOSPITAL_BASED_OUTPATIENT_CLINIC_OR_DEPARTMENT_OTHER): Payer: Self-pay

## 2022-06-15 ENCOUNTER — Other Ambulatory Visit (HOSPITAL_BASED_OUTPATIENT_CLINIC_OR_DEPARTMENT_OTHER): Payer: Self-pay

## 2022-06-15 MED ORDER — WEGOVY 0.5 MG/0.5ML ~~LOC~~ SOAJ
0.5000 mg | SUBCUTANEOUS | 0 refills | Status: DC
  Filled 2022-06-15: qty 2, 28d supply, fill #0

## 2022-06-18 ENCOUNTER — Other Ambulatory Visit (HOSPITAL_BASED_OUTPATIENT_CLINIC_OR_DEPARTMENT_OTHER): Payer: Self-pay

## 2022-07-02 ENCOUNTER — Encounter (HOSPITAL_BASED_OUTPATIENT_CLINIC_OR_DEPARTMENT_OTHER): Payer: Self-pay | Admitting: Pharmacist

## 2022-07-02 ENCOUNTER — Other Ambulatory Visit (HOSPITAL_BASED_OUTPATIENT_CLINIC_OR_DEPARTMENT_OTHER): Payer: Self-pay

## 2022-07-03 ENCOUNTER — Other Ambulatory Visit (HOSPITAL_BASED_OUTPATIENT_CLINIC_OR_DEPARTMENT_OTHER): Payer: Self-pay

## 2022-07-03 MED ORDER — WEGOVY 0.5 MG/0.5ML ~~LOC~~ SOAJ
0.5000 mg | SUBCUTANEOUS | 0 refills | Status: AC
  Filled 2022-07-03 – 2022-07-05 (×2): qty 2, 28d supply, fill #0

## 2022-07-05 ENCOUNTER — Other Ambulatory Visit (HOSPITAL_BASED_OUTPATIENT_CLINIC_OR_DEPARTMENT_OTHER): Payer: Self-pay

## 2022-07-05 MED ORDER — SEMAGLUTIDE-WEIGHT MANAGEMENT 0.5 MG/0.5ML ~~LOC~~ SOAJ: 0.5000 mg | SUBCUTANEOUS | 0 refills | Status: AC

## 2022-07-05 MED ORDER — SEMAGLUTIDE-WEIGHT MANAGEMENT 0.5 MG/0.5ML ~~LOC~~ SOAJ
0.5000 mg | SUBCUTANEOUS | 0 refills | Status: AC
  Filled 2022-07-05: qty 2, 28d supply, fill #0

## 2022-07-06 ENCOUNTER — Other Ambulatory Visit (HOSPITAL_BASED_OUTPATIENT_CLINIC_OR_DEPARTMENT_OTHER): Payer: Self-pay

## 2022-07-06 ENCOUNTER — Other Ambulatory Visit: Payer: Self-pay

## 2022-07-06 MED ORDER — WEGOVY 0.25 MG/0.5ML ~~LOC~~ SOAJ
0.2500 mg | SUBCUTANEOUS | 0 refills | Status: AC
  Filled 2022-07-06: qty 2, 28d supply, fill #0

## 2022-07-06 MED ORDER — WEGOVY 0.25 MG/0.5ML ~~LOC~~ SOAJ: SUBCUTANEOUS | 0 refills | Status: AC

## 2022-08-22 ENCOUNTER — Other Ambulatory Visit (HOSPITAL_BASED_OUTPATIENT_CLINIC_OR_DEPARTMENT_OTHER): Payer: Self-pay

## 2022-08-22 MED ORDER — WEGOVY 1 MG/0.5ML ~~LOC~~ SOAJ
1.0000 mg | SUBCUTANEOUS | 0 refills | Status: AC
  Filled 2022-08-22: qty 2, 28d supply, fill #0

## 2022-08-29 ENCOUNTER — Other Ambulatory Visit (HOSPITAL_BASED_OUTPATIENT_CLINIC_OR_DEPARTMENT_OTHER): Payer: Self-pay

## 2023-03-18 ENCOUNTER — Encounter (HOSPITAL_BASED_OUTPATIENT_CLINIC_OR_DEPARTMENT_OTHER): Payer: Self-pay

## 2023-03-18 ENCOUNTER — Emergency Department (HOSPITAL_BASED_OUTPATIENT_CLINIC_OR_DEPARTMENT_OTHER)
Admission: EM | Admit: 2023-03-18 | Discharge: 2023-03-19 | Disposition: A | Discharge status: Home / Self Care | Payer: BC Managed Care – PPO | Attending: Emergency Medicine | Admitting: Emergency Medicine

## 2023-03-18 ENCOUNTER — Other Ambulatory Visit: Payer: Self-pay

## 2023-03-18 ENCOUNTER — Encounter (HOSPITAL_BASED_OUTPATIENT_CLINIC_OR_DEPARTMENT_OTHER): Payer: Self-pay | Admitting: Emergency Medicine

## 2023-03-18 ENCOUNTER — Emergency Department (HOSPITAL_BASED_OUTPATIENT_CLINIC_OR_DEPARTMENT_OTHER)
Admission: EM | Admit: 2023-03-18 | Discharge: 2023-03-18 | Disposition: A | Discharge status: Home / Self Care | Payer: BC Managed Care – PPO | Attending: Emergency Medicine | Admitting: Emergency Medicine

## 2023-03-18 DIAGNOSIS — Z96659 Presence of unspecified artificial knee joint: Secondary | ICD-10-CM | POA: Insufficient documentation

## 2023-03-18 DIAGNOSIS — R112 Nausea with vomiting, unspecified: Secondary | ICD-10-CM | POA: Insufficient documentation

## 2023-03-18 LAB — CBC WITH DIFFERENTIAL/PLATELET
Abs Immature Granulocytes: 0.03 10*3/uL (ref 0.00–0.07)
Basophils Absolute: 0 10*3/uL (ref 0.0–0.1)
Basophils Relative: 0 %
Eosinophils Absolute: 0 10*3/uL (ref 0.0–0.5)
Eosinophils Relative: 0 %
HCT: 41.5 % (ref 36.0–46.0)
Hemoglobin: 13.8 g/dL (ref 12.0–15.0)
Immature Granulocytes: 0 %
Lymphocytes Relative: 13 %
Lymphs Abs: 1.1 10*3/uL (ref 0.7–4.0)
MCH: 29.6 pg (ref 26.0–34.0)
MCHC: 33.3 g/dL (ref 30.0–36.0)
MCV: 88.9 fL (ref 80.0–100.0)
Monocytes Absolute: 0.3 10*3/uL (ref 0.1–1.0)
Monocytes Relative: 3 %
Neutro Abs: 7 10*3/uL (ref 1.7–7.7)
Neutrophils Relative %: 84 %
Platelets: 320 10*3/uL (ref 150–400)
RBC: 4.67 MIL/uL (ref 3.87–5.11)
RDW: 12.9 % (ref 11.5–15.5)
WBC: 8.4 10*3/uL (ref 4.0–10.5)
nRBC: 0 % (ref 0.0–0.2)

## 2023-03-18 LAB — URINALYSIS, ROUTINE W REFLEX MICROSCOPIC
Bilirubin Urine: NEGATIVE
Glucose, UA: NEGATIVE mg/dL
Hgb urine dipstick: NEGATIVE
Ketones, ur: 40 mg/dL — AB
Leukocytes,Ua: NEGATIVE
Nitrite: NEGATIVE
Protein, ur: NEGATIVE mg/dL
Specific Gravity, Urine: 1.02 (ref 1.005–1.030)
pH: 8.5 — ABNORMAL HIGH (ref 5.0–8.0)

## 2023-03-18 LAB — COMPREHENSIVE METABOLIC PANEL
ALT: 58 U/L — ABNORMAL HIGH (ref 0–44)
AST: 40 U/L (ref 15–41)
Albumin: 4.6 g/dL (ref 3.5–5.0)
Alkaline Phosphatase: 38 U/L (ref 38–126)
Anion gap: 11 (ref 5–15)
BUN: 10 mg/dL (ref 6–20)
CO2: 22 mmol/L (ref 22–32)
Calcium: 9.7 mg/dL (ref 8.9–10.3)
Chloride: 104 mmol/L (ref 98–111)
Creatinine, Ser: 0.62 mg/dL (ref 0.44–1.00)
GFR, Estimated: >60 mL/min (ref >60)
Glucose, Bld: 98 mg/dL (ref 70–99)
Potassium: 3.7 mmol/L (ref 3.5–5.1)
Sodium: 137 mmol/L (ref 135–145)
Total Bilirubin: 0.8 mg/dL (ref <1.2)
Total Protein: 7.8 g/dL (ref 6.5–8.1)

## 2023-03-18 LAB — PREGNANCY, URINE: Preg Test, Ur: NEGATIVE

## 2023-03-18 LAB — LIPASE, BLOOD: Lipase: 26 U/L (ref 11–51)

## 2023-03-18 MED ORDER — METHOCARBAMOL 500 MG PO TABS
500.0000 mg | ORAL_TABLET | Freq: Once | ORAL | Status: AC
  Administered 2023-03-18: 500 mg via ORAL
  Filled 2023-03-18: qty 1

## 2023-03-18 MED ORDER — HYDROMORPHONE HCL 1 MG/ML IJ SOLN
1.0000 mg | Freq: Once | INTRAMUSCULAR | Status: AC
  Administered 2023-03-18: 1 mg via INTRAVENOUS
  Filled 2023-03-18: qty 1

## 2023-03-18 MED ORDER — ONDANSETRON HCL 4 MG/2ML IJ SOLN
4.0000 mg | Freq: Once | INTRAMUSCULAR | Status: AC
  Administered 2023-03-18: 4 mg via INTRAVENOUS
  Filled 2023-03-18: qty 2

## 2023-03-18 MED ORDER — DIAZEPAM 5 MG/ML IJ SOLN
2.5000 mg | Freq: Once | INTRAMUSCULAR | Status: AC
  Administered 2023-03-18: 2.5 mg via INTRAVENOUS

## 2023-03-18 MED ORDER — DIAZEPAM 5 MG/ML IJ SOLN
5.0000 mg | Freq: Once | INTRAMUSCULAR | Status: DC
  Filled 2023-03-18: qty 2

## 2023-03-18 MED ORDER — ACETAMINOPHEN 500 MG PO TABS
1000.0000 mg | ORAL_TABLET | Freq: Once | ORAL | Status: AC
  Administered 2023-03-18: 1000 mg via ORAL
  Filled 2023-03-18: qty 2

## 2023-03-18 MED ORDER — KETOROLAC TROMETHAMINE 15 MG/ML IJ SOLN
15.0000 mg | Freq: Once | INTRAMUSCULAR | Status: AC
  Administered 2023-03-18: 15 mg via INTRAVENOUS
  Filled 2023-03-18: qty 1

## 2023-03-18 MED ORDER — DROPERIDOL 2.5 MG/ML IJ SOLN
1.2500 mg | Freq: Once | INTRAMUSCULAR | Status: AC
  Administered 2023-03-18: 1.25 mg via INTRAVENOUS
  Filled 2023-03-18: qty 2

## 2023-03-18 MED ORDER — LACTATED RINGERS IV BOLUS
1000.0000 mL | Freq: Once | INTRAVENOUS | Status: AC
  Administered 2023-03-18: 1000 mL via INTRAVENOUS

## 2023-03-18 MED ORDER — MORPHINE SULFATE (PF) 4 MG/ML IV SOLN
4.0000 mg | Freq: Once | INTRAVENOUS | Status: AC
  Administered 2023-03-18: 4 mg via INTRAVENOUS
  Filled 2023-03-18: qty 1

## 2023-03-18 MED ORDER — SODIUM CHLORIDE 0.9 % IV BOLUS
1000.0000 mL | Freq: Once | INTRAVENOUS | Status: AC
  Administered 2023-03-18: 1000 mL via INTRAVENOUS

## 2023-03-18 NOTE — ED Triage Notes (Signed)
Emesis today , no relief despite Zofran . Gl abd pain . BM today .  Right knee surgery on 11/13. Unable to take her pain meds due to vomiting .

## 2023-03-18 NOTE — ED Provider Notes (Incomplete)
[place] [facility] AT [facility] Provider Note   CSN: [id number] Arrival date & time: 03/18/23  2127     History {Add pertinent medical, surgical, social history, OB history to HPI:1} Chief Complaint  Patient presents with  . Emesis    Jordan Harris is a 42 y.o. female who presents emergency department for vomiting.  Patient was seen earlier today.  She reports that this morning she had a bowel movement followed by intractable vomiting.  Because of her vomiting she was unable to hold down any scheduled pain made medicine.  She is 2 weeks out from a partial knee replacement with Dr. Dannielle Huh.  Patient was seen earlier today and had an extensive workup with reassuring labs however although her pain was controlled when she came home had repetitive vomiting was unable to hold anything down so now returns.  She denies any significant abdominal pain.  Her knee pain is well-controlled.  She feels weak and exhausted from vomiting..   Emesis      Home Medications Prior to Admission medications   Medication Sig Start Date End Date Taking? Authorizing Provider  dicyclomine (BENTYL) 20 MG tablet Take 1 tablet (20 mg total) by mouth 4 (four) times daily as needed (for abdominal cramping). 03/29/21   Molpus, John, MD  FLUoxetine (PROZAC) 40 MG capsule Take 40 mg by mouth daily.    [provider]  ondansetron (ZOFRAN-ODT) 8 MG disintegrating tablet Take 1 tablet (8 mg total) by mouth every 8 (eight) hours as needed for nausea or vomiting. 8mg  ODT q4 hours prn nausea 03/29/21   Molpus, John, MD  Semaglutide-Weight Management (WEGOVY) 0.25 MG/0.5ML SOAJ Inject 0.25 mg into the skin once a week. 07/06/22     Semaglutide-Weight Management (WEGOVY) 0.25 MG/0.5ML SOAJ Inject 0.5 mLs (0.25 mg dose) into the skin once a week. 07/06/22     Semaglutide-Weight Management (WEGOVY) 0.5 MG/0.5ML SOAJ Inject 0.5 mg into the skin once a week. 07/02/22     Semaglutide-Weight  Management (WEGOVY) 1 MG/0.5ML SOAJ Inject 1 mg into the skin once a week. 08/22/22     Semaglutide-Weight Management 0.5 MG/0.5ML SOAJ Inject 0.5 mg into the skin once a week. 06/29/22     Semaglutide-Weight Management 0.5 MG/0.5ML SOAJ Inject 0.5 mg into the skin once a week. 06/29/22         Allergies    Patient has no known allergies.    Review of Systems   Review of Systems  Gastrointestinal:  Positive for vomiting.    Physical Exam Updated Vital Signs BP 127/88 (BP Location: Right Arm)   Pulse (!) 109   Temp 98.2 F (36.8 C)   Resp 18   Ht 5\' 7"  (1.702 m)   Wt 93 kg   SpO2 95%   BMI 32.11 kg/m  Physical Exam Vitals and nursing note reviewed.  Constitutional:      General: She is not in acute distress.    Appearance: She is well-developed. She is not diaphoretic.  HENT:     Head: Normocephalic and atraumatic.     Right Ear: External ear normal.     Left Ear: External ear normal.     Nose: Nose normal.     Mouth/Throat:     Mouth: Mucous membranes are moist.  Eyes:     General: No scleral icterus.    Conjunctiva/sclera: Conjunctivae normal.  Cardiovascular:     Rate and Rhythm: Normal rate and regular rhythm.     Heart  sounds: Normal heart sounds. No murmur heard.    No friction rub. No gallop.  Pulmonary:     Effort: Pulmonary effort is normal. No respiratory distress.     Breath sounds: Normal breath sounds.  Abdominal:     General: Bowel sounds are normal. There is no distension.     Palpations: Abdomen is soft. There is no mass.     Tenderness: There is no abdominal tenderness. There is no guarding.  Musculoskeletal:     Cervical back: Normal range of motion.  Skin:    General: Skin is warm and dry.  Neurological:     Mental Status: She is alert and oriented to person, place, and time.  Psychiatric:        Behavior: Behavior normal.     ED Results / Procedures / Treatments   Labs (all labs ordered are listed, but only abnormal results are  displayed) Labs Reviewed - No data to display  EKG None  Radiology No results found.  Procedures Procedures  {Document cardiac monitor, telemetry assessment procedure when appropriate:1}  Medications Ordered in ED Medications  lactated ringers bolus 1,000 mL (1,000 mLs Intravenous New Bag/Given 03/18/23 2242)  droperidol (INAPSINE) 2.5 MG/ML injection 1.25 mg (1.25 mg Intravenous Given 03/18/23 2242)  diazepam (VALIUM) injection 2.5 mg (2.5 mg Intravenous Given 03/18/23 2237)    ED Course/ Medical Decision Making/ A&P   {   Click here for ABCD2, HEART and other calculatorsREFRESH Note before signing :1}                              Medical Decision Making Risk Prescription drug management.   ***  {Document critical care time when appropriate:1} {Document review of labs and clinical decision tools ie heart score, Chads2Vasc2 etc:1}  {Document your independent review of radiology images, and any outside records:1} {Document your discussion with family members, caretakers, and with consultants:1} {Document social determinants of health affecting pt's care:1} {Document your decision making why or why not admission, treatments were needed:1} Final Clinical Impression(s) / ED Diagnoses Final diagnoses:  None    Rx / DC Orders ED Discharge Orders     None

## 2023-03-18 NOTE — ED Triage Notes (Signed)
Pt reports she was just discharged from here earlier today for vomiting and dehydration. Recent knee surgery on 11/13. She reports she is back because she has vomited 3 more times since her d/c and she feels very weak.

## 2023-03-18 NOTE — ED Provider Notes (Addendum)
[place] [facility] AT [facility] Provider Note   CSN: [id number] Arrival date & time: 03/18/23  1053     History  Chief Complaint  Patient presents with   Emesis    Jordan Harris is a 42 y.o. female.  42 year old female presents today for concern of worsening pain with associated nausea and vomiting.  She states she has not been able to take her home medications today due to the nausea and vomiting.  No abdominal pain.  She reached out to her surgeon who recommended she come in for IV fluids.  No fever or other concerns regarding the wound.  The history is provided by the patient. No language interpreter was used.       Home Medications Prior to Admission medications   Medication Sig Start Date End Date Taking? Authorizing Provider  dicyclomine (BENTYL) 20 MG tablet Take 1 tablet (20 mg total) by mouth 4 (four) times daily as needed (for abdominal cramping). 03/29/21   Molpus, John, MD  FLUoxetine (PROZAC) 40 MG capsule Take 40 mg by mouth daily.    [provider]  ondansetron (ZOFRAN-ODT) 8 MG disintegrating tablet Take 1 tablet (8 mg total) by mouth every 8 (eight) hours as needed for nausea or vomiting. 8mg  ODT q4 hours prn nausea 03/29/21   Molpus, John, MD  Semaglutide-Weight Management (WEGOVY) 0.25 MG/0.5ML SOAJ Inject 0.25 mg into the skin once a week. 07/06/22     Semaglutide-Weight Management (WEGOVY) 0.25 MG/0.5ML SOAJ Inject 0.5 mLs (0.25 mg dose) into the skin once a week. 07/06/22     Semaglutide-Weight Management (WEGOVY) 0.5 MG/0.5ML SOAJ Inject 0.5 mg into the skin once a week. 07/02/22     Semaglutide-Weight Management (WEGOVY) 1 MG/0.5ML SOAJ Inject 1 mg into the skin once a week. 08/22/22     Semaglutide-Weight Management 0.5 MG/0.5ML SOAJ Inject 0.5 mg into the skin once a week. 06/29/22     Semaglutide-Weight Management 0.5 MG/0.5ML SOAJ Inject 0.5 mg into the skin once a week. 06/29/22         Allergies    Patient has no known  allergies.    Review of Systems   Review of Systems  Constitutional:  Negative for chills and fever.  Gastrointestinal:  Positive for nausea and vomiting. Negative for abdominal pain.  Genitourinary:  Negative for dysuria.  Neurological:  Negative for light-headedness.  All other systems reviewed and are negative.   Physical Exam Updated Vital Signs BP 124/81   Pulse 66   Temp 98.4 F (36.9 C) (Oral)   Resp 16   Wt 93 kg   SpO2 99%   BMI 32.11 kg/m  Physical Exam Vitals and nursing note reviewed.  Constitutional:      General: She is not in acute distress.    Appearance: Normal appearance. She is not ill-appearing.  HENT:     Head: Normocephalic and atraumatic.     Nose: Nose normal.  Eyes:     Conjunctiva/sclera: Conjunctivae normal.  Cardiovascular:     Rate and Rhythm: Normal rate and regular rhythm.  Pulmonary:     Effort: Pulmonary effort is normal. No respiratory distress.  Abdominal:     General: There is no distension.     Palpations: Abdomen is soft.     Tenderness: There is no abdominal tenderness. There is no right CVA tenderness, left CVA tenderness, guarding or rebound.  Musculoskeletal:        General: No deformity. Normal range of motion.  Skin:  Findings: No rash.  Neurological:     Mental Status: She is alert.     ED Results / Procedures / Treatments   Labs (all labs ordered are listed, but only abnormal results are displayed) Labs Reviewed  COMPREHENSIVE METABOLIC PANEL - Abnormal; Notable for the following components:      Result Value   ALT 58 (*)    All other components within normal limits  URINALYSIS, ROUTINE W REFLEX MICROSCOPIC - Abnormal; Notable for the following components:   pH 8.5 (*)    Ketones, ur 40 (*)    All other components within normal limits  CBC WITH DIFFERENTIAL/PLATELET  LIPASE, BLOOD  PREGNANCY, URINE    EKG None  Radiology No results found.  Procedures Procedures    Medications Ordered in  ED Medications  methocarbamol (ROBAXIN) tablet 500 mg (has no administration in time range)  ondansetron (ZOFRAN) injection 4 mg (4 mg Intravenous Given 03/18/23 1259)  morphine (PF) 4 MG/ML injection 4 mg (4 mg Intravenous Given 03/18/23 1259)  sodium chloride 0.9 % bolus 1,000 mL ( Intravenous Stopped 03/18/23 1359)  HYDROmorphone (DILAUDID) injection 1 mg (1 mg Intravenous Given 03/18/23 1355)  acetaminophen (TYLENOL) tablet 1,000 mg (1,000 mg Oral Given 03/18/23 1353)  ketorolac (TORADOL) 15 MG/ML injection 15 mg (15 mg Intravenous Given 03/18/23 1354)    ED Course/ Medical Decision Making/ A&P                                 Medical Decision Making Amount and/or Complexity of Data Reviewed Labs: ordered.  Risk OTC drugs. Prescription drug management.   42 year old female presents today for concern of nausea and vomiting and concern for dehydration.  She has not been able to take her home medications today which has made her right knee pain worse.  Abdominal exam benign.  Hemodynamically stable.  Pain control provided.  Fluid bolus given.  On reevaluation patient reports significant improvement in symptoms.  Able to tolerate p.o. intake.  CBC unremarkable, CMP without acute concern.  UA without evidence of UTI does have some ketones which could indicate dehydration.  Pregnancy test negative.  Lipase within normal.  Patient requesting discharge.  Discharged in stable condition.  Return precaution discussed.  Patient voices understanding and is in agreement with plan.  Incision over the right knee without any signs of infection.  Final Clinical Impression(s) / ED Diagnoses Final diagnoses:  Nausea and vomiting, unspecified vomiting type    Rx / DC Orders ED Discharge Orders     None         Marita Kansas, PA-C 03/18/23 1735    Marita Kansas, PA-C 03/18/23 1735    Sloan Leiter, DO 03/19/23 [id number]

## 2023-03-18 NOTE — Discharge Instructions (Signed)
Your symptoms were better after medicines and fluids in the emergency department.  Follow-up with your primary care provider in surgeon.  Return for any concerns.  Blood work was overall reassuring.

## 2023-03-18 NOTE — ED Provider Notes (Signed)
[place] [facility] AT [facility] Provider Note   CSN: [id number] Arrival date & time: 03/18/23  2127     History  Chief Complaint  Patient presents with   Emesis    Jordan Harris is a 42 y.o. female who presents emergency department for vomiting.  Patient was seen earlier today.  She reports that this morning she had a bowel movement followed by intractable vomiting.  Because of her vomiting she was unable to hold down any scheduled pain made medicine.  She is 2 weeks out from a partial knee replacement with Dr. Dannielle Huh.  Patient was seen earlier today and had an extensive workup with reassuring labs however although her pain was controlled when she came home had repetitive vomiting was unable to hold anything down so now returns.  She denies any significant abdominal pain.  Her knee pain is well-controlled.  She feels weak and exhausted from vomiting..   Emesis      Home Medications Prior to Admission medications   Medication Sig Start Date End Date Taking? Authorizing Provider  dicyclomine (BENTYL) 20 MG tablet Take 1 tablet (20 mg total) by mouth 4 (four) times daily as needed (for abdominal cramping). 03/29/21   Molpus, John, MD  FLUoxetine (PROZAC) 40 MG capsule Take 40 mg by mouth daily.    [provider]  ondansetron (ZOFRAN-ODT) 8 MG disintegrating tablet Take 1 tablet (8 mg total) by mouth every 8 (eight) hours as needed for nausea or vomiting. 8mg  ODT q4 hours prn nausea 03/29/21   Molpus, John, MD  Semaglutide-Weight Management (WEGOVY) 0.25 MG/0.5ML SOAJ Inject 0.25 mg into the skin once a week. 07/06/22     Semaglutide-Weight Management (WEGOVY) 0.25 MG/0.5ML SOAJ Inject 0.5 mLs (0.25 mg dose) into the skin once a week. 07/06/22     Semaglutide-Weight Management (WEGOVY) 0.5 MG/0.5ML SOAJ Inject 0.5 mg into the skin once a week. 07/02/22     Semaglutide-Weight Management (WEGOVY) 1 MG/0.5ML SOAJ Inject 1 mg into the skin once a week.  08/22/22     Semaglutide-Weight Management 0.5 MG/0.5ML SOAJ Inject 0.5 mg into the skin once a week. 06/29/22     Semaglutide-Weight Management 0.5 MG/0.5ML SOAJ Inject 0.5 mg into the skin once a week. 06/29/22         Allergies    Patient has no known allergies.    Review of Systems   Review of Systems  Gastrointestinal:  Positive for vomiting.    Physical Exam Updated Vital Signs BP 127/88 (BP Location: Right Arm)   Pulse (!) 109   Temp 98.2 F (36.8 C)   Resp 18   Ht 5\' 7"  (1.702 m)   Wt 93 kg   SpO2 95%   BMI 32.11 kg/m  Physical Exam Vitals and nursing note reviewed.  Constitutional:      General: She is not in acute distress.    Appearance: She is well-developed. She is not diaphoretic.  HENT:     Head: Normocephalic and atraumatic.     Right Ear: External ear normal.     Left Ear: External ear normal.     Nose: Nose normal.     Mouth/Throat:     Mouth: Mucous membranes are moist.  Eyes:     General: No scleral icterus.    Conjunctiva/sclera: Conjunctivae normal.  Cardiovascular:     Rate and Rhythm: Normal rate and regular rhythm.     Heart sounds: Normal heart sounds. No murmur heard.  No friction rub. No gallop.  Pulmonary:     Effort: Pulmonary effort is normal. No respiratory distress.     Breath sounds: Normal breath sounds.  Abdominal:     General: Bowel sounds are normal. There is no distension.     Palpations: Abdomen is soft. There is no mass.     Tenderness: There is no abdominal tenderness. There is no guarding.  Musculoskeletal:     Cervical back: Normal range of motion.  Skin:    General: Skin is warm and dry.  Neurological:     Mental Status: She is alert and oriented to person, place, and time.  Psychiatric:        Behavior: Behavior normal.     ED Results / Procedures / Treatments   Labs (all labs ordered are listed, but only abnormal results are displayed) Labs Reviewed - No data to display  EKG None  Radiology No results  found.  Procedures Procedures    Medications Ordered in ED Medications  lactated ringers bolus 1,000 mL (1,000 mLs Intravenous New Bag/Given 03/18/23 2242)  droperidol (INAPSINE) 2.5 MG/ML injection 1.25 mg (1.25 mg Intravenous Given 03/18/23 2242)  diazepam (VALIUM) injection 2.5 mg (2.5 mg Intravenous Given 03/18/23 2237)    ED Course/ Medical Decision Making/ A&P                                 Medical Decision Making Patient here with vomiting The emergent differential diagnosis for vomiting includes, but is not limited to ACS/MI, DKA, Ischemic bowel, Meningitis, Sepsis, Acute gastric dilation, Adrenal insufficiency, Appendicitis,  Bowel obstruction/ileus, Carbon monoxide poisoning, Cholecystitis, Electrolyte abnormalities, Elevated ICP, Gastric outlet obstruction, Pancreatitis, Ruptured viscus, Biliary colic, Cannabinoid hyperemesis syndrome, Gastritis, Gastroenteritis, Gastroparesis,  Narcotic withdrawal, Peptic ulcer disease, and UTI  Patient's labs done earlier today are reassuring.  She has a benign abdominal exam.  Patient given droperidol and Valium with significant improvement in her nausea however she continues to have some at this time.  Current plan is to continue to observe the patient.  If she has persistent severe nausea or vomiting she may need to come in for intractable vomiting.  She appears well at this time.  Risk Prescription drug management.          Final Clinical Impression(s) / ED Diagnoses Final diagnoses:  None    Rx / DC Orders ED Discharge Orders     None         Arthor Captain, PA-C 03/19/23 0023    Virgina Norfolk, DO 03/19/23 1657

## 2023-03-19 MED ORDER — PROCHLORPERAZINE 25 MG RE SUPP: 25.0000 mg | Freq: Two times a day (BID) | RECTAL | 0 refills | Status: AC | PRN

## 2023-03-19 MED ORDER — DIPHENHYDRAMINE HCL 50 MG/ML IJ SOLN
25.0000 mg | Freq: Once | INTRAMUSCULAR | Status: AC
  Administered 2023-03-19: 25 mg via INTRAVENOUS
  Filled 2023-03-19: qty 1

## 2023-03-19 NOTE — ED Provider Notes (Signed)
I assumed care of this patient from previous provider.  Please see their note for further details of history, exam, and MDM.   Briefly patient is a 42 y.o. female who presented N/V. Treated symptomatically. Previously seen earlier in the day for the same and lab reassuring. No need for repeat.  Plan was to reevaluate patient and p.o. challenge.  1:54 AM Patient reports feeling much better and able to tolerate p.o.  Instructed to increase her dose of Zofran from 4 mg to 8 mg 3 times daily as needed.  Additionally Compazine suppositories ordered in case Zofran does not work.  The patient appears reasonably screened and/or stabilized for discharge and I doubt any other medical condition or other [facility] requiring further screening, evaluation, or treatment in the ED at this time. I have discussed the findings, Dx and Tx plan with the patient/family who expressed understanding and agree(s) with the plan. Discharge instructions discussed at length. The patient/family was given strict return precautions who verbalized understanding of the instructions. No further questions at time of discharge.  Disposition: Discharge  Condition: Good  ED Discharge Orders          Ordered    prochlorperazine (COMPAZINE) 25 MG suppository  Every 12 hours PRN        03/19/23 0148             Follow Up: Venia Minks, NP [address] [place] [address] [place] [address] [phone]  Call  to schedule an appointment for close follow up         Nahiara Kretzschmar, Amadeo Garnet, MD 03/19/23 0155

## 2023-03-19 NOTE — ED Notes (Signed)
The patient is A&OX4, wheeled out of ED via wheelchair, NAD. Pt verbalized understanding of d/c instructions, prescription and follow up care.

## 2023-03-19 NOTE — ED Notes (Signed)
Pt able to ambulate to bathroom w walker, denies nausea upon return to room

## 2023-03-19 NOTE — ED Notes (Signed)
Pt asked to drink pedialyte from home and given saltines, EDP approved
